# Patient Record
Sex: Male | Born: 1965 | Hispanic: Yes | Marital: Married | Smoking: Never smoker
Health system: Southern US, Community
[De-identification: ages and names within clinical notes are randomized; demographics above are authoritative.]

## PROBLEM LIST (undated history)

## (undated) DIAGNOSIS — K219 Gastro-esophageal reflux disease without esophagitis: Secondary | ICD-10-CM

## (undated) DIAGNOSIS — E785 Hyperlipidemia, unspecified: Secondary | ICD-10-CM

## (undated) DIAGNOSIS — J3089 Other allergic rhinitis: Secondary | ICD-10-CM

## (undated) DIAGNOSIS — K5900 Constipation, unspecified: Secondary | ICD-10-CM

## (undated) HISTORY — DX: Hyperlipidemia, unspecified: E78.5

## (undated) HISTORY — DX: Constipation, unspecified: K59.00

## (undated) HISTORY — DX: Other allergic rhinitis: J30.89

---

## 2011-05-19 ENCOUNTER — Ambulatory Visit: Payer: Self-pay | Admitting: Family Medicine

## 2011-05-19 VITALS — BP 143/78 | HR 101 | Temp 98.0°F | Resp 16 | Ht 68.5 in | Wt 177.0 lb

## 2011-05-19 DIAGNOSIS — R002 Palpitations: Secondary | ICD-10-CM

## 2011-05-20 ENCOUNTER — Encounter: Payer: Self-pay | Admitting: Family Medicine

## 2011-05-20 NOTE — Progress Notes (Signed)
  [facility]:  Office Visit  Chief Complaint: No chief complaint on file.   HPI: Eric Beasley is a 46 y.o. male who complains of  Chest palpitations since this morning around 10 am. He took a energy drink without diluting it and it had 24 servings in the bottle, each serving contained 50 mg of caffeine. Patient did not read the label before downing the bottle. He wanted to get some more energy for his morning run. No other associated sxs: denies CP, SOB, HA, vision changes, numbness, tingling, diaphoresis, weakness. Denies illicit drug use, denies h/o HTN, XOl, T2DM, tobacco use.  History reviewed. No pertinent past medical history. History reviewed. No pertinent past surgical history. History   Social History  . Marital Status: Unknown    Spouse Name: N/A    Number of Children: N/A  . Years of Education: N/A   Social History Main Topics  . Smoking status: Never Smoker   . Smokeless tobacco: None  . Alcohol Use: No  . Drug Use: No  . Sexually Active:    Other Topics Concern  . None   Social History Narrative  . None   History reviewed. No pertinent family history. Allergies not on file Prior to Admission medications   Not on File     ROS: The patient denies fevers, chills, night sweats, unintentional weight loss, chest pain,wheezing, dyspnea on exertion, nausea, vomiting, abdominal pain, dysuria, hematuria, melena, numbness, weakness, or tingling. + palpitations,   All other systems have been reviewed and were otherwise negative with the exception of those mentioned in the HPI and as above.    PHYSICAL EXAM: Filed Vitals:   05/19/11 1513  BP: 143/78  Pulse: 101  Temp: 98 F (36.7 C)  Resp: 16   Filed Vitals:   05/19/11 1513  Height: 5' 8.5" (1.74 m)  Weight: 177 lb (80.287 kg)   Body mass index is 26.52 kg/(m^2).  General: Alert, no acute distress HEENT:  Normocephalic, atraumatic, oropharynx patent.  Cardiovascular:   Regular rate and rhythm, no rubs murmurs or gallops.  No Carotid bruits, radial pulse intact. No pedal edema.  Respiratory: Clear to auscultation bilaterally.  No wheezes, rales, or rhonchi.  No cyanosis, no use of accessory musculature GI: No organomegaly, abdomen is soft and non-tender, positive bowel sounds.  No masses. Skin: No rashes. Neurologic: Facial musculature symmetric. Psychiatric: Patient is appropriate throughout our interaction. Lymphatic: No cervical lymphadenopathy Musculoskeletal: Gait intact.   LABS: No results found for this or any previous visit.   EKG/XRAY:   Primary read interpreted by Dr. Conley Rolls at [facility]. EKG was NSR at 94 bpm, no ST/T wave changes   ASSESSMENT/PLAN: Encounter Diagnosis  Name Primary?  . Heart palpitations Yes   Advise patient to push fluids, will be several hours if not full day for caffeine to get out of his system. If he has worsening sxs or new sxs then need to go to ER for evaluation.     Hamilton Capri PHUONG, DO 05/21/2011 5:48 PM

## 2011-06-06 ENCOUNTER — Encounter: Payer: Self-pay | Admitting: Family Medicine

## 2011-10-21 ENCOUNTER — Ambulatory Visit
Admission: RE | Admit: 2011-10-21 | Discharge: 2011-10-21 | Disposition: A | Discharge status: Home / Self Care | Payer: No Typology Code available for payment source | Source: Ambulatory Visit | Attending: Geriatric Medicine | Admitting: Geriatric Medicine

## 2011-10-21 ENCOUNTER — Other Ambulatory Visit: Payer: Self-pay | Admitting: Geriatric Medicine

## 2011-10-21 DIAGNOSIS — R1032 Left lower quadrant pain: Secondary | ICD-10-CM

## 2013-10-26 ENCOUNTER — Ambulatory Visit: Payer: Self-pay | Attending: Internal Medicine

## 2014-04-13 ENCOUNTER — Ambulatory Visit: Payer: Self-pay | Attending: Internal Medicine

## 2015-03-12 DIAGNOSIS — K219 Gastro-esophageal reflux disease without esophagitis: Secondary | ICD-10-CM

## 2015-03-12 HISTORY — DX: Gastro-esophageal reflux disease without esophagitis: K21.9

## 2016-02-24 ENCOUNTER — Emergency Department (HOSPITAL_BASED_OUTPATIENT_CLINIC_OR_DEPARTMENT_OTHER)
Admission: EM | Admit: 2016-02-24 | Discharge: 2016-02-25 | Disposition: A | Discharge status: Home / Self Care | Payer: Self-pay | Attending: Emergency Medicine | Admitting: Emergency Medicine

## 2016-02-24 ENCOUNTER — Encounter (HOSPITAL_BASED_OUTPATIENT_CLINIC_OR_DEPARTMENT_OTHER): Payer: Self-pay | Admitting: Emergency Medicine

## 2016-02-24 ENCOUNTER — Emergency Department (HOSPITAL_BASED_OUTPATIENT_CLINIC_OR_DEPARTMENT_OTHER): Payer: Self-pay

## 2016-02-24 DIAGNOSIS — R1013 Epigastric pain: Secondary | ICD-10-CM | POA: Insufficient documentation

## 2016-02-24 DIAGNOSIS — K5900 Constipation, unspecified: Secondary | ICD-10-CM | POA: Insufficient documentation

## 2016-02-24 DIAGNOSIS — R1012 Left upper quadrant pain: Secondary | ICD-10-CM | POA: Insufficient documentation

## 2016-02-24 HISTORY — DX: Gastro-esophageal reflux disease without esophagitis: K21.9

## 2016-02-24 LAB — CBC WITH DIFFERENTIAL/PLATELET
BASOS ABS: 0 10*3/uL (ref 0.0–0.1)
BASOS PCT: 1 %
EOS ABS: 0.2 10*3/uL (ref 0.0–0.7)
EOS PCT: 4 %
HCT: 41.6 % (ref 39.0–52.0)
Hemoglobin: 14.7 g/dL (ref 13.0–17.0)
LYMPHS PCT: 42 %
Lymphs Abs: 2.4 10*3/uL (ref 0.7–4.0)
MCH: 30.4 pg (ref 26.0–34.0)
MCHC: 35.3 g/dL (ref 30.0–36.0)
MCV: 86 fL (ref 78.0–100.0)
Monocytes Absolute: 0.3 10*3/uL (ref 0.1–1.0)
Monocytes Relative: 6 %
Neutro Abs: 2.8 10*3/uL (ref 1.7–7.7)
Neutrophils Relative %: 47 %
PLATELETS: 253 10*3/uL (ref 150–400)
RBC: 4.84 MIL/uL (ref 4.22–5.81)
RDW: 12.4 % (ref 11.5–15.5)
WBC: 5.8 10*3/uL (ref 4.0–10.5)

## 2016-02-24 LAB — COMPREHENSIVE METABOLIC PANEL
ALT: 38 U/L (ref 17–63)
AST: 27 U/L (ref 15–41)
Albumin: 4.4 g/dL (ref 3.5–5.0)
Alkaline Phosphatase: 53 U/L (ref 38–126)
Anion gap: 7 (ref 5–15)
BILIRUBIN TOTAL: 0.4 mg/dL (ref 0.3–1.2)
BUN: 18 mg/dL (ref 6–20)
CHLORIDE: 104 mmol/L (ref 101–111)
CO2: 26 mmol/L (ref 22–32)
CREATININE: 0.83 mg/dL (ref 0.61–1.24)
Calcium: 9.1 mg/dL (ref 8.9–10.3)
Glucose, Bld: 99 mg/dL (ref 65–99)
POTASSIUM: 3.5 mmol/L (ref 3.5–5.1)
Sodium: 137 mmol/L (ref 135–145)
TOTAL PROTEIN: 7.4 g/dL (ref 6.5–8.1)

## 2016-02-24 LAB — LIPASE, BLOOD: LIPASE: 20 U/L (ref 11–51)

## 2016-02-24 MED ORDER — GI COCKTAIL ~~LOC~~
30.0000 mL | Freq: Once | ORAL | Status: AC
  Administered 2016-02-24: 30 mL via ORAL
  Filled 2016-02-24: qty 30

## 2016-02-24 NOTE — ED Notes (Signed)
Per friend, has had stomach problems for a year? Worse after eating, denies ETOH except occasional beer. Has had constipation in the past, last BM this am, denies n/v

## 2016-02-24 NOTE — ED Provider Notes (Signed)
[facility] Provider Note   CSN: [id number] Arrival date & time: 02/24/16  2104  By signing my name below, I, Eric Beasley, attest that this documentation has been prepared under the direction and in the presence of Geoffery Lyonsouglas Makiah Foye, MD . Electronically Signed: Nelwyn SalisburyJoshua Beasley, Scribe. 02/24/2016. 11:16 PM.  History   Chief Complaint Chief Complaint  Patient presents with  . Abdominal Pain   The history is provided by a relative. The history is limited by a language barrier. No language interpreter was used.  Abdominal Pain   This is a chronic problem. The current episode started more than 1 week ago. The problem occurs constantly. The problem has not changed since onset.The pain is associated with eating. The pain is located in the epigastric region. The pain is at a severity of 8/10. The pain is moderate. Associated symptoms include constipation. Pertinent negatives include fever, diarrhea and vomiting. The symptoms are aggravated by eating. Nothing relieves the symptoms.    HPI Comments:  Eric Beasley is a 50 y.o. male with pmhx of GERD who presents to the Emergency Department complaining of sudden-onset constant worsened epigastric abdominal pain which began a year ago but has exacerbated in the past two weeks. Pt describes his symptoms as an 8/10 burning pain that began on the left side of his abdomen, but has moved to the right side. He describes a sensation of "stuck food" and gassiness, exacerbated by eating. No alleviating factors indicated. He reports associated constipation. Pt denies any vomiting, diarrhea, or fever.   Past Medical History:  Diagnosis Date  . GERD (gastroesophageal reflux disease)     There are no active problems to display for this patient.   History reviewed. No pertinent surgical history.     Home Medications    Prior to Admission medications   Not on File    Family History History reviewed. No pertinent family  history.  Social History Social History  Substance Use Topics  . Smoking status: Never Smoker  . Smokeless tobacco: Never Used  . Alcohol use No     Allergies   Naproxen   Review of Systems Review of Systems  Constitutional: Negative for fever.  Gastrointestinal: Positive for abdominal pain and constipation. Negative for diarrhea and vomiting.  All other systems reviewed and are negative.    Physical Exam Updated Vital Signs BP 141/94 (BP Location: Right Arm)   Pulse 66   Temp 98.4 F (36.9 C) (Oral)   Resp 20   SpO2 100%   Physical Exam  Constitutional: He is oriented to person, place, and time. He appears well-developed and well-nourished.  HENT:  Head: Normocephalic and atraumatic.  Eyes: EOM are normal.  Neck: Normal range of motion.  Cardiovascular: Normal rate, regular rhythm, normal heart sounds and intact distal pulses.   Pulmonary/Chest: Effort normal and breath sounds normal. No respiratory distress.  Abdominal: Soft. Bowel sounds are normal. He exhibits no distension. There is tenderness.  TTP in epigastrum and left upper quadrant  Musculoskeletal: Normal range of motion.  Neurological: He is alert and oriented to person, place, and time.  Skin: Skin is warm and dry.  Psychiatric: He has a normal mood and affect. Judgment normal.  Nursing note and vitals reviewed.    ED Treatments / Results  DIAGNOSTIC STUDIES:  Oxygen Saturation is 100% on RA, normal by my interpretation.    COORDINATION OF CARE:  11:21 PM Discussed treatment plan with pt at bedside which includes blood work and imaging and  pt agreed to plan.  Labs (all labs ordered are listed, but only abnormal results are displayed) Labs Reviewed - No data to display  EKG  EKG Interpretation None       Radiology No results found.  Procedures Procedures (including critical care time)  Medications Ordered in ED Medications - No data to display   Initial Impression /  Assessment and Plan / ED Course  I have reviewed the triage vital signs and the nursing notes.  Pertinent labs & imaging results that were available during my care of the patient were reviewed by me and considered in my medical decision making (see chart for details).  Clinical Course     Patient presents with epigastric burning. His symptoms improved with a GI cocktail and workup is essentially unremarkable. Obstruction series reveals no abnormal bowel gas pattern or stool burden. I suspect GERD. He will be treated with omeprazole and when necessary follow-up with his primary doctor.  Final Clinical Impressions(s) / ED Diagnoses   Final diagnoses:  None    New Prescriptions New Prescriptions   No medications on file  I personally performed the services described in this documentation, which was scribed in my presence. The recorded information has been reviewed and is accurate.        Geoffery Lyonsouglas Eric Weckerly, MD 02/25/16 [phone]

## 2016-02-24 NOTE — ED Triage Notes (Signed)
Patient states that he is having burning in his stomach after he eats x 3 years Patient repors that he beeing treated for GERD - and he got better.

## 2016-02-25 MED ORDER — OMEPRAZOLE 20 MG PO CPDR: 20.0000 mg | DELAYED_RELEASE_CAPSULE | Freq: Two times a day (BID) | ORAL | 0 refills | Status: DC

## 2016-02-25 NOTE — ED Notes (Signed)
Pt and wife given d/c instructions as per chart. Rx x 1. Verbalizes understanding. No questions. 

## 2016-02-25 NOTE — Discharge Instructions (Signed)
Prilosec as prescribed.  Follow-up with your primary Dr. if not improving in the next week, and return to the ER if your symptoms significantly worsen or change.

## 2017-05-19 IMAGING — CR DG ABDOMEN ACUTE W/ 1V CHEST
3 series · 3 of 3 positions shown · non-contrast
Comparison: Abdominal radiograph October 21, 2011

CLINICAL DATA: Burning abdominal pain after eating for 3 years.
History of reflux disease.

EXAM:
DG ABDOMEN ACUTE W/ 1V CHEST

[w chest pa]
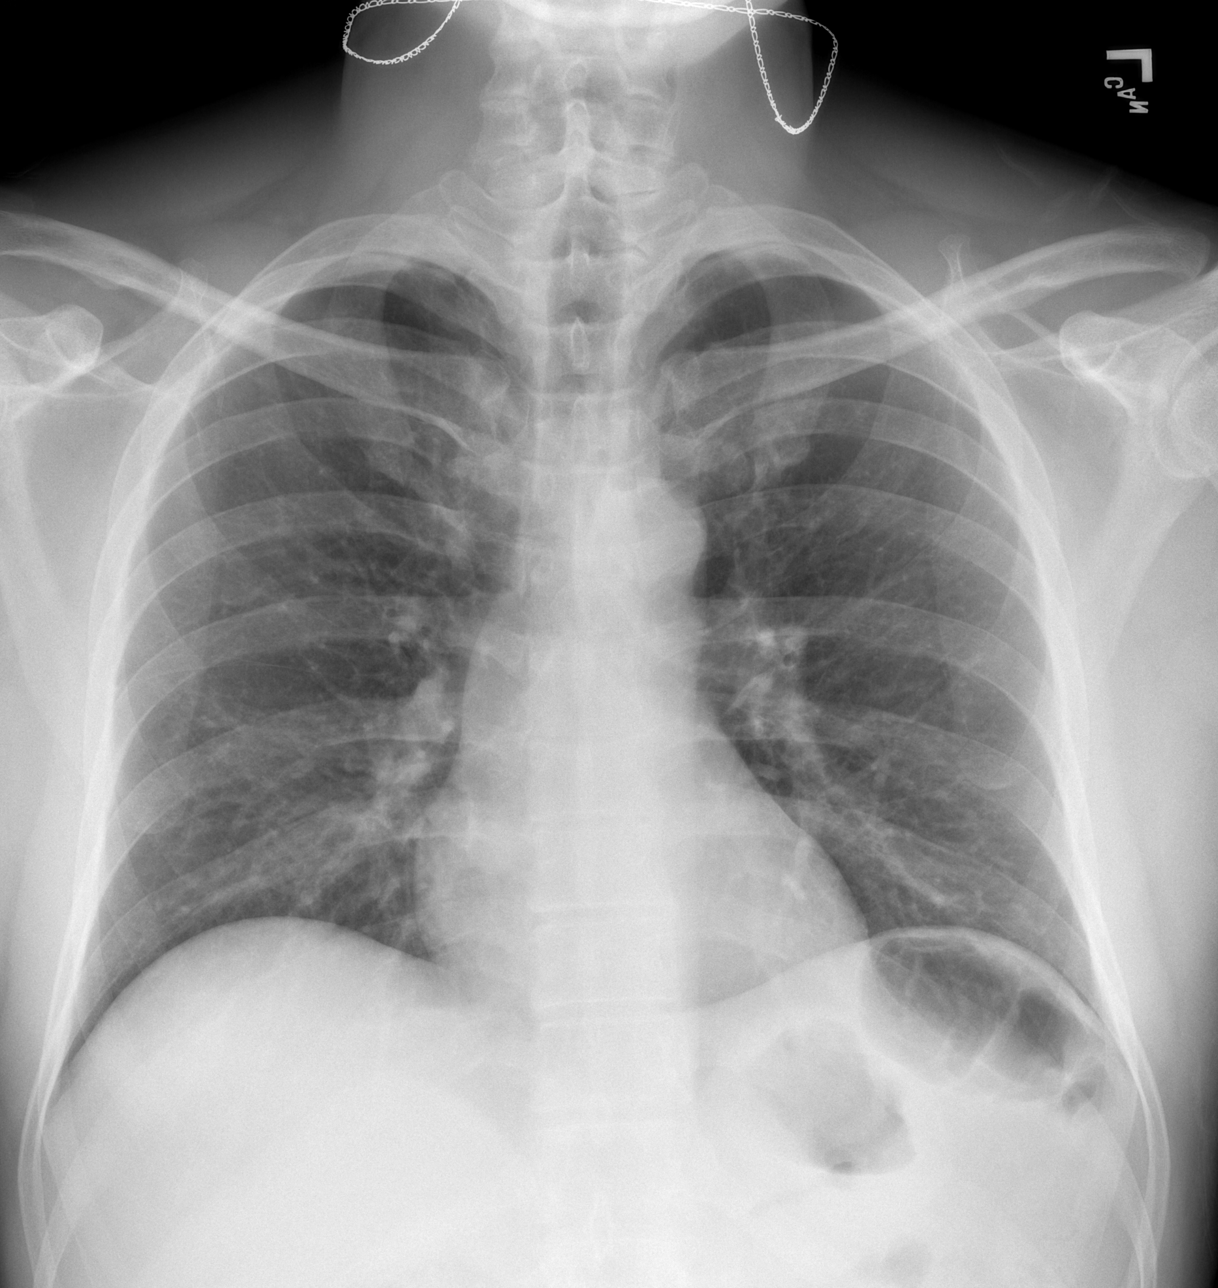

[w abdomen upright]
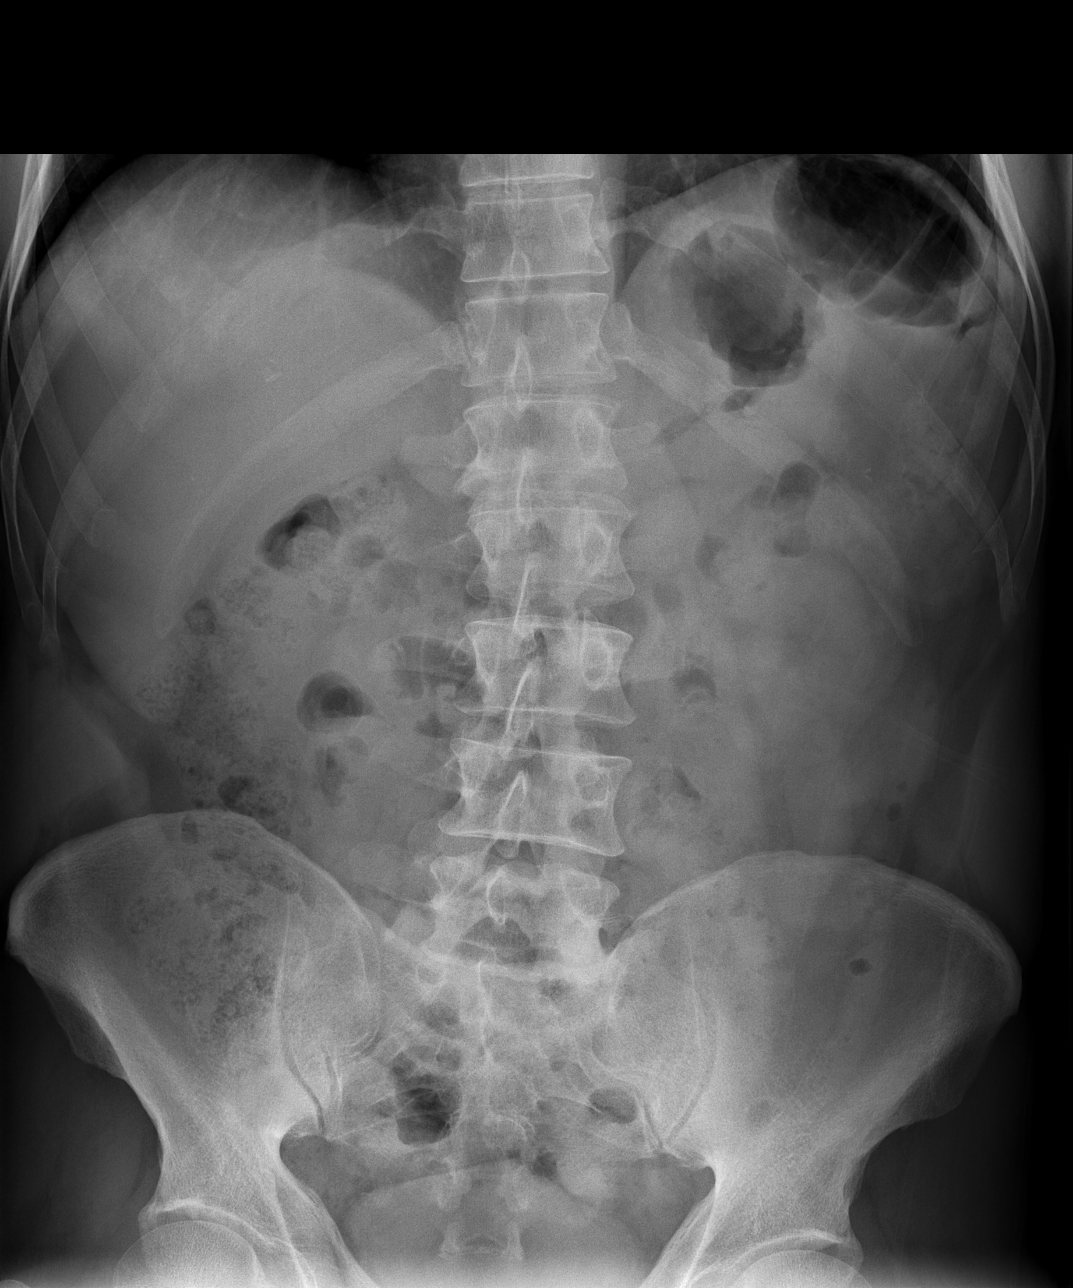

[t abdomen supine]
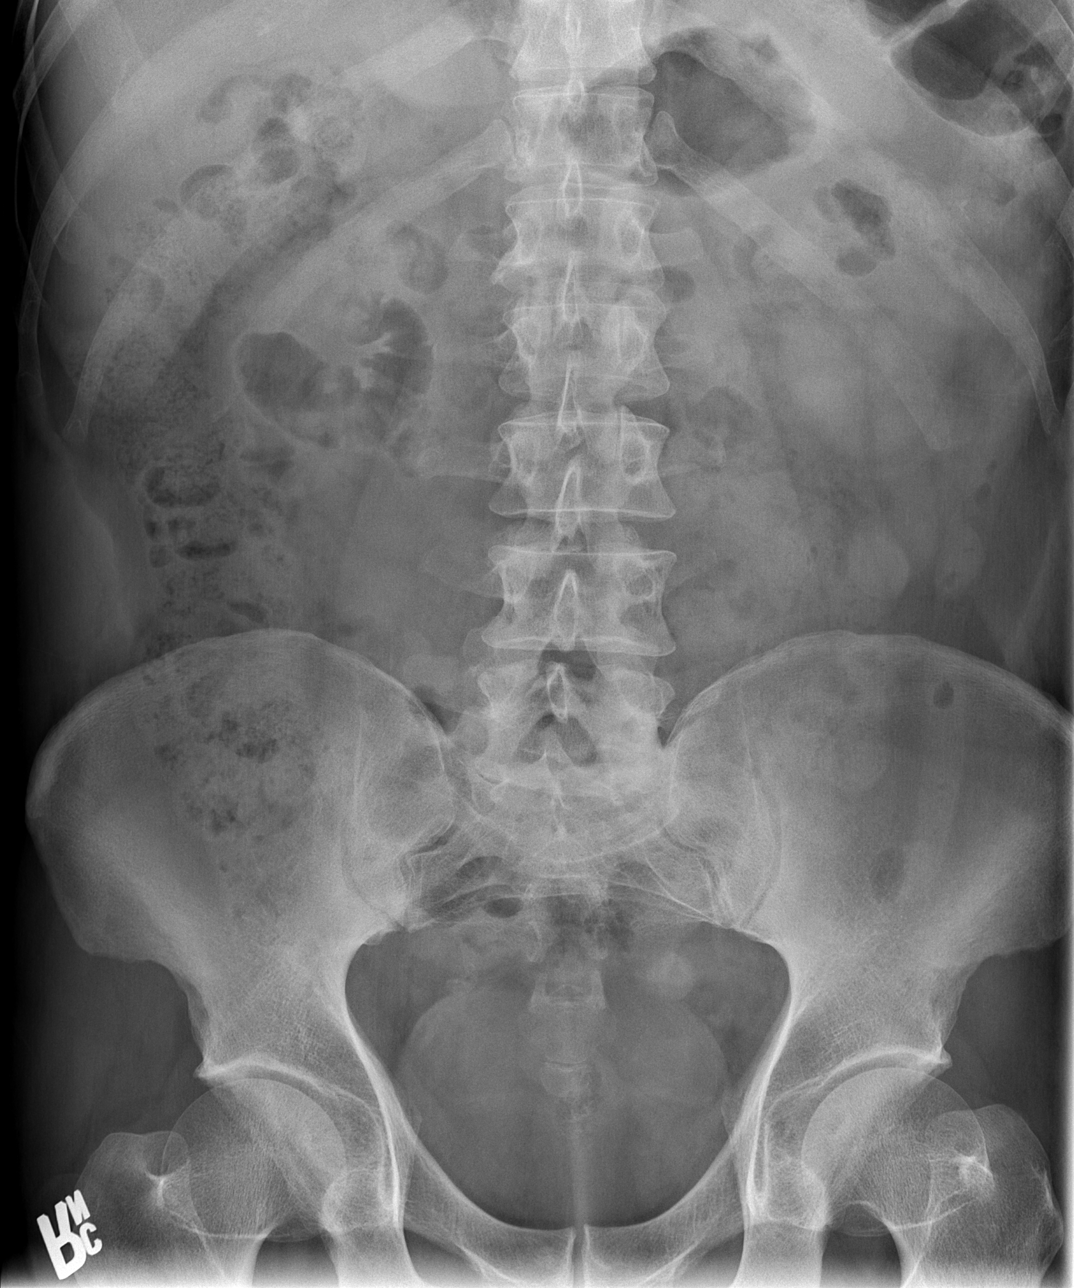

[3 of 3 positions shown; findings below may reference images not displayed]

FINDINGS: Cardiomediastinal silhouette is normal. Lungs are clear, no pleural
effusions. No pneumothorax. Soft tissue planes and included osseous
structures are unremarkable.

Bowel gas pattern is nondilated and nonobstructive. No
intra-abdominal mass effect, pathologic calcifications or free air.
Soft tissue planes and included osseous structures are
non-suspicious.
IMPRESSION: Normal chest radiograph.

Normal bowel gas pattern.

## 2020-02-01 ENCOUNTER — Ambulatory Visit: Payer: Self-pay | Admitting: Internal Medicine

## 2020-02-01 ENCOUNTER — Other Ambulatory Visit: Payer: Self-pay

## 2020-02-01 ENCOUNTER — Encounter: Payer: Self-pay | Admitting: Internal Medicine

## 2020-02-01 VITALS — BP 140/90 | HR 72 | Resp 14 | Ht 66.0 in | Wt 171.0 lb

## 2020-02-01 DIAGNOSIS — R140 Abdominal distension (gaseous): Secondary | ICD-10-CM

## 2020-02-01 DIAGNOSIS — Z9189 Other specified personal risk factors, not elsewhere classified: Secondary | ICD-10-CM

## 2020-02-01 DIAGNOSIS — S39011S Strain of muscle, fascia and tendon of abdomen, sequela: Secondary | ICD-10-CM

## 2020-02-01 DIAGNOSIS — R030 Elevated blood-pressure reading, without diagnosis of hypertension: Secondary | ICD-10-CM

## 2020-02-01 DIAGNOSIS — J3089 Other allergic rhinitis: Secondary | ICD-10-CM

## 2020-02-01 MED ORDER — LORATADINE 10 MG PO TABS: 10.0000 mg | ORAL_TABLET | Freq: Every day | ORAL | 11 refills | Status: DC

## 2020-02-01 NOTE — Patient Instructions (Signed)

## 2020-02-01 NOTE — Progress Notes (Signed)
Subjective:    Patient ID: Eric Beasley, male   DOB: Jul 24, 1965, 54 y.o.   MRN: [id number]   HPI   Here to establish. Estefania Geanie Berlin interpreting.  1.  Abdominal pain:  Left lower abdominal pain.  Feels like the pain is inside his abdominal cavity, not the wall.  Hurts more with eating too much.  Does not hurt any other time.  If passes gas or has a BM, pain improves.  When he eats spicy food or dairy products, his BMs are odiferous.  Has a lot of abdominal gas noise also with eating above items.   No problem if does not overeat or stays away from lactose. Also, had intermittent constipation which responded well to Metamucil in the past.  Not a big vegetable eater.   2  Secondary pain in abdomen--same area LLQ:  Was performing sort of reverse push ups 4-5 months ago and felt something pull in that area.  States he was working out at the time and is afraid it will recur if works out.  Apparently, has continued to work out without problem, just has not performed the same sort of reverse push ups.  Has not noted anything that sounds like a hernia in the area with working out.  No discomfort there since.    3.  Elevated BP:  Has been told this in the past.  Apparently, was treated with medication.  States his libido decreased when used the medication, so stopped on his own.  Sounds like about 1 year ago.  Took the medication once daily.    4.  Allergies:  Itching eyes and skin.  Eyes do not turn red or water.  Taking Claritin.    No outpatient medications have been marked as taking for the 02/01/20 encounter (Office Visit) with Julieanne Manson, MD.   Allergies  Allergen Reactions  . Naproxen Rash   Past Medical History:  Diagnosis Date  . GERD (gastroesophageal reflux disease) 2017    History reviewed. No pertinent surgical history.  Family History  Problem Relation Age of Onset  . Cirrhosis Father   . Alcohol abuse Father     Social History    Socioeconomic History  . Marital status: Married    Spouse name: Conception Rosas  . Number of children: 3  . Years of education: 6  . Highest education level: 6th grade  Occupational History  . Occupation: IT consultant  Tobacco Use  . Smoking status: Never Smoker  . Smokeless tobacco: Never Used  Vaping Use  . Vaping Use: Never used  Substance and Sexual Activity  . Alcohol use: Yes    Comment: Rare  . Drug use: No  . Sexual activity: Yes    Birth control/protection: None  Other Topics Concern  . Not on file  Social History Narrative   Lives with his wife and a friend   His 2 children with current wife live in [place] as does his oldest daughter.   Social Determinants of Health   Financial Resource Strain: Low Risk   . Difficulty of Paying Living Expenses: Not very hard  Food Insecurity: No Food Insecurity  . Worried About Programme researcher, broadcasting/film/video in the Last Year: Never true  . Ran Out of Food in the Last Year: Never true  Transportation Needs: No Transportation Needs  . Lack of Transportation (Medical): No  . Lack of Transportation (Non-Medical): No  Physical Activity:   . Days of Exercise per Week: Not on  file  . Minutes of Exercise per Session: Not on file  Stress:   . Feeling of Stress : Not on file  Social Connections:   . Frequency of Communication with Friends and Family: Not on file  . Frequency of Social Gatherings with Friends and Family: Not on file  . Attends Religious Services: Not on file  . Active Member of Clubs or Organizations: Not on file  . Attends Banker Meetings: Not on file  . Marital Status: Not on file  Intimate Partner Violence: Not At Risk  . Fear of Current or Ex-Partner: No  . Emotionally Abused: No  . Physically Abused: No  . Sexually Abused: No      Review of Systems    Objective:   BP 140/90 (BP Location: Left Arm, Patient Position: Sitting, Cuff Size: Normal)   Pulse 72   Resp 14   Ht 5\' 6"  (1.676 m)    Wt 171 lb (77.6 kg)   BMI 27.60 kg/m   Physical Exam  NAD Appears in good shape HEENT:   PERRL, EOMI, no injection or watering, nasal mucosa mildly injected, but dry.  TMs pearly gray Throat without injection Neck:  Supple, No adenopathy, no thyromegaly Chest:  CTA CV:  RRR with normal S1 and S2, No S3, S4 or murmur.  No carotid bruits.  Carotid, radial and DP pulses normal and equal Abd:  S, Mild tenderness at edge of abdominis rectus muscle in LLQ, No rebound or peritoneal signs.  No HSM or mass, No focal muscle opening or hernia palpated.  + BS LE:  No edema.   Assessment & Plan  1.  Abdominal Pain:  Postprandial bloating by his description.  Encouraged a healthy diet with increased vegetables and fruits/fiber.  Restart Metamucil. Increase water intake.   Avoid lactose:  Take Lactaid with milk containing products and switch to almond, soy milk or Lactaid milk Avoid overeating. If continues with symptoms following dietary changes, consider antispasmodic Also with left rectus tenderness on exam today--stretches recommended. Follow up in 2 weeks for fasting labs as well:  CBC, CMP, FLP  2.  Elevated BP:  Nurse check in 2 weeks with labs.  Consider medication if still elevated.  3.  Allergies:  Loratadine 10 mg daily as needed.  4.  Need for Dental Care:  Dental referral.

## 2020-03-19 DIAGNOSIS — R140 Abdominal distension (gaseous): Secondary | ICD-10-CM | POA: Insufficient documentation

## 2020-06-12 ENCOUNTER — Encounter: Payer: Self-pay | Admitting: Internal Medicine

## 2020-06-12 ENCOUNTER — Other Ambulatory Visit: Payer: Self-pay

## 2020-06-12 ENCOUNTER — Ambulatory Visit: Payer: Self-pay | Admitting: Internal Medicine

## 2020-06-12 VITALS — BP 116/84

## 2020-06-12 DIAGNOSIS — R140 Abdominal distension (gaseous): Secondary | ICD-10-CM

## 2020-06-12 DIAGNOSIS — Z13220 Encounter for screening for lipoid disorders: Secondary | ICD-10-CM

## 2020-06-12 DIAGNOSIS — R030 Elevated blood-pressure reading, without diagnosis of hypertension: Secondary | ICD-10-CM

## 2020-06-12 NOTE — Progress Notes (Signed)
BP fine today on recheck.

## 2020-06-13 LAB — CBC WITH DIFFERENTIAL/PLATELET
Basophils Absolute: 0.1 10*3/uL (ref 0.0–0.2)
Basos: 2 %
EOS (ABSOLUTE): 0.1 10*3/uL (ref 0.0–0.4)
Eos: 3 %
Hematocrit: 41.4 % (ref 37.5–51.0)
Hemoglobin: 14.2 g/dL (ref 13.0–17.7)
Immature Grans (Abs): 0 10*3/uL (ref 0.0–0.1)
Immature Granulocytes: 1 %
Lymphocytes Absolute: 1.4 10*3/uL (ref 0.7–3.1)
Lymphs: 41 %
MCH: 30.3 pg (ref 26.6–33.0)
MCHC: 34.3 g/dL (ref 31.5–35.7)
MCV: 88 fL (ref 79–97)
Monocytes Absolute: 0.3 10*3/uL (ref 0.1–0.9)
Monocytes: 8 %
Neutrophils Absolute: 1.6 10*3/uL (ref 1.4–7.0)
Neutrophils: 45 %
Platelets: 257 10*3/uL (ref 150–450)
RBC: 4.69 x10E6/uL (ref 4.14–5.80)
RDW: 13.1 % (ref 11.6–15.4)
WBC: 3.4 10*3/uL (ref 3.4–10.8)

## 2020-06-13 LAB — LIPID PANEL W/O CHOL/HDL RATIO
Cholesterol, Total: 234 mg/dL — ABNORMAL HIGH (ref 100–199)
HDL: 38 mg/dL — ABNORMAL LOW (ref >39)
LDL Chol Calc (NIH): 153 mg/dL — ABNORMAL HIGH (ref 0–99)
Triglycerides: 235 mg/dL — ABNORMAL HIGH (ref 0–149)
VLDL Cholesterol Cal: 43 mg/dL — ABNORMAL HIGH (ref 5–40)

## 2020-06-13 LAB — COMPREHENSIVE METABOLIC PANEL
ALT: 28 IU/L (ref 0–44)
AST: 22 IU/L (ref 0–40)
Albumin/Globulin Ratio: 1.8 (ref 1.2–2.2)
Albumin: 4.7 g/dL (ref 3.8–4.9)
Alkaline Phosphatase: 61 IU/L (ref 44–121)
BUN/Creatinine Ratio: 20 (ref 9–20)
BUN: 17 mg/dL (ref 6–24)
Bilirubin Total: 0.5 mg/dL (ref 0.0–1.2)
CO2: 22 mmol/L (ref 20–29)
Calcium: 9.1 mg/dL (ref 8.7–10.2)
Chloride: 104 mmol/L (ref 96–106)
Creatinine, Ser: 0.83 mg/dL (ref 0.76–1.27)
Globulin, Total: 2.6 g/dL (ref 1.5–4.5)
Glucose: 95 mg/dL (ref 65–99)
Potassium: 4.2 mmol/L (ref 3.5–5.2)
Sodium: 141 mmol/L (ref 134–144)
Total Protein: 7.3 g/dL (ref 6.0–8.5)
eGFR: 105 mL/min/{1.73_m2} (ref >59)

## 2021-01-22 ENCOUNTER — Other Ambulatory Visit: Payer: Self-pay

## 2021-01-22 DIAGNOSIS — Z13220 Encounter for screening for lipoid disorders: Secondary | ICD-10-CM

## 2021-01-23 LAB — LIPID PANEL W/O CHOL/HDL RATIO
Cholesterol, Total: 201 mg/dL — ABNORMAL HIGH (ref 100–199)
HDL: 38 mg/dL — ABNORMAL LOW (ref >39)
LDL Chol Calc (NIH): 129 mg/dL — ABNORMAL HIGH (ref 0–99)
Triglycerides: 192 mg/dL — ABNORMAL HIGH (ref 0–149)
VLDL Cholesterol Cal: 34 mg/dL (ref 5–40)

## 2021-01-26 ENCOUNTER — Other Ambulatory Visit: Payer: Self-pay

## 2021-01-26 ENCOUNTER — Ambulatory Visit: Payer: Self-pay | Admitting: Internal Medicine

## 2021-01-26 ENCOUNTER — Encounter: Payer: Self-pay | Admitting: Internal Medicine

## 2021-01-26 VITALS — BP 130/86 | HR 68 | Resp 12 | Ht 67.0 in | Wt 170.0 lb

## 2021-01-26 DIAGNOSIS — B351 Tinea unguium: Secondary | ICD-10-CM

## 2021-01-26 DIAGNOSIS — Z125 Encounter for screening for malignant neoplasm of prostate: Secondary | ICD-10-CM

## 2021-01-26 DIAGNOSIS — R140 Abdominal distension (gaseous): Secondary | ICD-10-CM

## 2021-01-26 DIAGNOSIS — Z79899 Other long term (current) drug therapy: Secondary | ICD-10-CM

## 2021-01-26 DIAGNOSIS — Z0000 Encounter for general adult medical examination without abnormal findings: Secondary | ICD-10-CM

## 2021-01-26 DIAGNOSIS — E782 Mixed hyperlipidemia: Secondary | ICD-10-CM

## 2021-01-26 MED ORDER — TERBINAFINE HCL 250 MG PO TABS: 250.0000 mg | ORAL_TABLET | Freq: Every day | ORAL | 0 refills | Status: DC

## 2021-01-26 NOTE — Progress Notes (Signed)
Subjective:    Patient ID: Eric Beasley, male   DOB: July 07, 1965, 55 y.o.   MRN: [id number]   HPI  Here for Male CPE:  1.  STE:  Does not perform.  No family history of testicular cancer.    2.  PSA:  Not drawn with fasting lipids 4 days ago.  May have been checked about 5 years ago and believes it was normal.  No family history of prostate cancer.    3.  Guaiac Cards/FIT:  May have performed many years ago and negative for blood.     4.  Colonoscopy:   Never.  No family history of colon cancer.   5.  Cholesterol/Glucose:  Hyperlipidemia, improved from April.  Total, HDL, Trigs remain out of goal range.  Changed his diet with better fats, and regular physical activity.  Glucose levels have been normal.    6.  Immunizations:  Thinks he received a tetanus vaccine about 5 years ago.  Suffered a laceration to his finger and his job sent him to a particular clinic.  Does not have documentation.   Has not ever received influenza vaccine.   Has received 3 COVID vaccines, but not the bivalent booster. Has not received the Shingrix vaccine.    Current Meds  Medication Sig   loratadine (CLARITIN) 10 MG tablet Take 1 tablet (10 mg total) by mouth daily.   psyllium (METAMUCIL) 58.6 % packet Take 1 packet by mouth daily.   Allergies  Allergen Reactions   Naproxen Rash   Past Medical History:  Diagnosis Date   GERD (gastroesophageal reflux disease) 2017   Hyperlipidemia    No past surgical history on file.  Family History  Problem Relation Age of Onset   Cirrhosis Father    Alcohol abuse Father    Family Status  Relation Name Status   Mother  Alive, age 73y   Father  Deceased at age 60   Sister  Alive   Brother  Alive   Daughter [place] Alive, age 15y   Sister  Alive   Sister  Alive   Sister  Alive   Brother  Alive   Brother  Alive   Daughter [place] Alive, age 13y   Daughter [place] Alive, age 22y       different mother than his younger daughters      Social History   Socioeconomic History   Marital status: Married    Spouse name: Conception Rosas   Number of children: 3   Years of education: 6   Highest education level: 6th grade  Occupational History   Occupation: IT consultant  Tobacco Use   Smoking status: Never   Smokeless tobacco: Never  Building services engineer Use: Never used  Substance and Sexual Activity   Alcohol use: Yes    Comment: Rare   Drug use: No   Sexual activity: Yes    Birth control/protection: None  Other Topics Concern   Not on file  Social History Narrative   Lives with his wife and a friend   His 2 children with current wife live in [place] as does his oldest daughter.   Social Determinants of Health   Financial Resource Strain: Low Risk    Difficulty of Paying Living Expenses: Not hard at all  Food Insecurity: No Food Insecurity   Worried About Programme researcher, broadcasting/film/video in the Last Year: Never true   Ran Out of Food in the Last Year: Never true  Transportation  Needs: No Transportation Needs   Lack of Transportation (Medical): No   Lack of Transportation (Non-Medical): No  Physical Activity: Not on file  Stress: Not on file  Social Connections: Not on file  Intimate Partner Violence: Not At Risk   Fear of Current or Ex-Partner: No   Emotionally Abused: No   Physically Abused: No   Sexually Abused: No     Review of Systems  HENT:  Negative for dental problem.   Respiratory:  Negative for shortness of breath.   Cardiovascular:  Negative for chest pain, palpitations and leg swelling.  Gastrointestinal:  Negative for blood in stool (No melena).       Epigastric pain after he eats too much.  May be also when he eats too fast.  If he is leaning forward, the discomfort is more prominent.  If reclines slightly and stretches out abdomen, no discomfort.   He takes Metamucil ever morning.  Feels it helps.    Neurological:  Negative for weakness and numbness.  Psychiatric/Behavioral:  Negative  for dysphoric mood. The patient is not nervous/anxious.      Objective:   BP 130/86 (BP Location: Left Arm, Patient Position: Sitting, Cuff Size: Normal)   Pulse 68   Resp 12   Ht 5\' 7"  (1.702 m)   Wt 170 lb (77.1 kg)   BMI 26.63 kg/m   Physical Exam HENT:     Head: Normocephalic and atraumatic.     Right Ear: Tympanic membrane, ear canal and external ear normal.     Left Ear: Tympanic membrane, ear canal and external ear normal.     Mouth/Throat:     Mouth: Mucous membranes are moist.     Pharynx: Oropharynx is clear.  Eyes:     Extraocular Movements: Extraocular movements intact.     Conjunctiva/sclera: Conjunctivae normal.     Pupils: Pupils are equal, round, and reactive to light.     Comments: Discs sharp  Neck:     Thyroid: No thyroid mass or thyromegaly.  Cardiovascular:     Rate and Rhythm: Normal rate and regular rhythm.     Heart sounds: S1 normal and S2 normal. No murmur heard.    No friction rub. No S3 or S4 sounds.     Comments: No carotid bruits.  Carotid, radial, femoral, DP and PT pulses normal and equal.   Pulmonary:     Effort: Pulmonary effort is normal.     Breath sounds: Normal breath sounds and air entry.  Abdominal:     General: Bowel sounds are normal.     Palpations: Abdomen is soft. There is no hepatomegaly, splenomegaly or mass.     Tenderness: There is no abdominal tenderness.     Hernia: No hernia is present.  Genitourinary:    Penis: Normal.      Testes:        Right: Tenderness or swelling not present. Right testis is descended.        Left: Tenderness or swelling not present. Left testis is descended.  Musculoskeletal:        General: Normal range of motion.     Cervical back: Normal range of motion and neck supple.     Right lower leg: No edema.     Left lower leg: No edema.  Feet:     Right foot:     Toenail Condition: Right toenails are abnormally thick. Fungal disease present.    Left foot:     Toenail Condition: Left  toenails are abnormally thick. Fungal disease present. Lymphadenopathy:     Head:     Right side of head: No submental or submandibular adenopathy.     Left side of head: No submental or submandibular adenopathy.     Cervical: No cervical adenopathy.     Upper Body:     Right upper body: No supraclavicular adenopathy.     Left upper body: No supraclavicular adenopathy.     Lower Body: No right inguinal adenopathy. No left inguinal adenopathy.  Skin:    General: Skin is warm.     Capillary Refill: Capillary refill takes less than 2 seconds.     Findings: No rash.  Neurological:     General: No focal deficit present.     Mental Status: He is alert and oriented to person, place, and time.     Cranial Nerves: Cranial nerves 2-12 are intact.     Sensory: Sensation is intact.     Motor: Motor function is intact.     Coordination: Coordination is intact.     Gait: Gait is intact.     Deep Tendon Reflexes: Reflexes are normal and symmetric.  Psychiatric:        Speech: Speech normal.        Behavior: Behavior normal. Behavior is cooperative.      Assessment & Plan    CPE Check to see if can add PSA to labs drawn 4 days ago.   Return in 1 week for influenza, COVID bivalent vaccine, and Shingrix. Will look in [facility] to see if Td documented.    2. Hyperlipidemia:  improved with lifestyle changes.   3.  Toenail onychomycosis:  Start Terbinafine.  Shoe and shower care discussed.  Follow up in 6 and 12 weeks.

## 2021-01-26 NOTE — Patient Instructions (Signed)

## 2021-02-05 ENCOUNTER — Other Ambulatory Visit: Payer: Self-pay

## 2021-02-05 ENCOUNTER — Ambulatory Visit (INDEPENDENT_AMBULATORY_CARE_PROVIDER_SITE_OTHER): Payer: Self-pay | Admitting: Internal Medicine

## 2021-02-05 DIAGNOSIS — Z23 Encounter for immunization: Secondary | ICD-10-CM

## 2021-02-19 LAB — COMPREHENSIVE METABOLIC PANEL
ALT: 24 IU/L (ref 0–44)
AST: 20 IU/L (ref 0–40)
Albumin/Globulin Ratio: 1.5 (ref 1.2–2.2)
Albumin: 4.4 g/dL (ref 3.8–4.9)
Alkaline Phosphatase: 65 IU/L (ref 44–121)
BUN/Creatinine Ratio: 23 — ABNORMAL HIGH (ref 9–20)
BUN: 20 mg/dL (ref 6–24)
Bilirubin Total: 0.3 mg/dL (ref 0.0–1.2)
CO2: 21 mmol/L (ref 20–29)
Calcium: 9.1 mg/dL (ref 8.7–10.2)
Chloride: 101 mmol/L (ref 96–106)
Creatinine, Ser: 0.86 mg/dL (ref 0.76–1.27)
Globulin, Total: 2.9 g/dL (ref 1.5–4.5)
Glucose: 91 mg/dL (ref 70–99)
Potassium: 4.5 mmol/L (ref 3.5–5.2)
Sodium: 139 mmol/L (ref 134–144)
Total Protein: 7.3 g/dL (ref 6.0–8.5)
eGFR: 104 mL/min/{1.73_m2} (ref >59)

## 2021-02-19 LAB — PSA: Prostate Specific Ag, Serum: 0.6 ng/mL (ref 0.0–4.0)

## 2021-02-19 LAB — SPECIMEN STATUS REPORT

## 2021-03-11 DIAGNOSIS — R739 Hyperglycemia, unspecified: Secondary | ICD-10-CM

## 2021-03-11 HISTORY — DX: Hyperglycemia, unspecified: R73.9

## 2021-03-13 ENCOUNTER — Ambulatory Visit: Payer: Self-pay | Admitting: Internal Medicine

## 2021-04-19 ENCOUNTER — Ambulatory Visit: Payer: Self-pay | Admitting: Internal Medicine

## 2021-04-26 ENCOUNTER — Other Ambulatory Visit: Payer: Self-pay

## 2021-04-26 ENCOUNTER — Encounter: Payer: Self-pay | Admitting: Internal Medicine

## 2021-04-26 ENCOUNTER — Ambulatory Visit: Payer: Self-pay | Admitting: Internal Medicine

## 2021-04-26 VITALS — BP 120/78 | HR 72 | Resp 12 | Ht 67.0 in | Wt 174.0 lb

## 2021-04-26 DIAGNOSIS — R0981 Nasal congestion: Secondary | ICD-10-CM

## 2021-04-26 DIAGNOSIS — Z79899 Other long term (current) drug therapy: Secondary | ICD-10-CM

## 2021-04-26 DIAGNOSIS — R140 Abdominal distension (gaseous): Secondary | ICD-10-CM

## 2021-04-26 DIAGNOSIS — Z23 Encounter for immunization: Secondary | ICD-10-CM

## 2021-04-26 DIAGNOSIS — B351 Tinea unguium: Secondary | ICD-10-CM | POA: Insufficient documentation

## 2021-04-26 LAB — POCT INFLUENZA A/B
Influenza A, POC: NEGATIVE
Influenza B, POC: NEGATIVE

## 2021-04-26 LAB — POC COVID19 BINAXNOW: SARS Coronavirus 2 Ag: NEGATIVE

## 2021-04-26 NOTE — Progress Notes (Signed)
° ° °  Subjective:    Patient ID: Eric Beasley, male   DOB: 1965/09/23, 56 y.o.   MRN: [id number]   HPI  Tildon Husky interprets Had negative COVID and influenza testing with URI symptoms recently.   Toenail onychomycosis:  tolerating Terbinafine well.    2.  Abdominal  Bloating:  not really working that much on eating smaller more frequent meals.  Continues on Metamucil.    Current Meds  Medication Sig   loratadine (CLARITIN) 10 MG tablet Take 1 tablet (10 mg total) by mouth daily.   psyllium (METAMUCIL) 58.6 % packet Take 1 packet by mouth daily.   terbinafine (LAMISIL) 250 MG tablet Take 1 tablet (250 mg total) by mouth daily.    Allergies  Allergen Reactions   Naproxen Rash     Review of Systems    Objective:   BP 120/78 (BP Location: Left Arm, Patient Position: Sitting, Cuff Size: Normal)    Pulse 72    Resp 12    Ht 5\' 7"  (1.702 m)    Wt 174 lb (78.9 kg)    BMI 27.25 kg/m   Physical Exam Right great toenail with good clearing of base.  Small toe nail is possibly clearing at base as well.  Assessment & Plan    Toenail onychomycosis:  improved.  Has 4 more weeks or so left.  Follow up thereafter with hepatic profile and repeat visit.  2.  Abdominal bloating after heavy meals:  encouraged changing eating habits.  Continue Metamucil  3.  HM:  Shingles Vaccine.  Repeat in 2-6 months.

## 2021-04-28 LAB — HEPATIC FUNCTION PANEL
ALT: 42 IU/L (ref 0–44)
AST: 30 IU/L (ref 0–40)
Albumin: 4.5 g/dL (ref 3.8–4.9)
Alkaline Phosphatase: 89 IU/L (ref 44–121)
Bilirubin Total: <0.2 mg/dL (ref 0.0–1.2)
Bilirubin, Direct: <0.1 mg/dL (ref 0.00–0.40)
Total Protein: 7.2 g/dL (ref 6.0–8.5)

## 2021-06-14 ENCOUNTER — Ambulatory Visit: Payer: Self-pay | Admitting: Internal Medicine

## 2021-07-27 ENCOUNTER — Other Ambulatory Visit: Payer: Self-pay

## 2021-10-05 ENCOUNTER — Encounter: Payer: Self-pay | Admitting: Internal Medicine

## 2021-10-05 ENCOUNTER — Ambulatory Visit: Payer: Self-pay | Admitting: Internal Medicine

## 2021-10-05 VITALS — BP 122/78 | HR 68 | Resp 12 | Ht 67.0 in | Wt 174.0 lb

## 2021-10-05 DIAGNOSIS — R1031 Right lower quadrant pain: Secondary | ICD-10-CM

## 2021-10-05 NOTE — Progress Notes (Signed)
    Subjective:    Patient ID: Eric Beasley, male   DOB: 1965-08-27, 56 y.o.   MRN: [id number]   HPI  Eric Beasley interprets:    Right inner thigh pain:  states he had this about 1 month ago.  Now having pain in right groin/lower abdomen.  Pain in thigh lasted for about 1 week and then resolved.  Cannot recall a preceding injury or overuse.  Described the pain as a poking or cramping pain, the latter when stretching the muscle.  States he would massage the area and would go away.   Now, having pain in right mid to lateral inguinal area and right lower quad of abdomen.  Notes pressure pain when stretches the area.  Never notes a bulge.  He describes the RLQ looks bigger than the left.   Only notes the discomfort when stretches or bears down/with physical activity.  Notes he is tender there when palpates. No change in discomfort with passing flatus or BM. No blood in stool or melena. No abnormalities with urination  Current Meds  Medication Sig   loratadine (CLARITIN) 10 MG tablet Take 1 tablet (10 mg total) by mouth daily.   psyllium (METAMUCIL) 58.6 % packet Take 1 packet by mouth daily.   Allergies  Allergen Reactions   Diclofenac    Naproxen Rash     Review of Systems    Objective:   BP 122/78 (BP Location: Right Arm, Patient Position: Sitting, Cuff Size: Normal)   Pulse 68   Resp 12   Ht 5\' 7"  (1.702 m)   Wt 174 lb (78.9 kg)   BMI 27.25 kg/m   Physical Exam NAD Lungs:  CTA CV:  RRR without murmur or rub.  Radial pulses normal and equal Abd:  S, + BS, No HSM or mass.  No palpable hernial opening or hernia, even with bearing down, but tender in RLQ/inguinal area.   GU:  no scrotal or testicular mass.   Assessment & Plan   Right inguinal area pain:  Unable to define clearly if hernia in area.  With his history, feel he needs evaluation by Gen Surgery.  Needs expedited orange card.  Will ask D , CHW to support his application process.

## 2022-02-01 ENCOUNTER — Encounter: Payer: Self-pay | Admitting: Internal Medicine

## 2022-02-08 ENCOUNTER — Ambulatory Visit: Payer: Self-pay | Admitting: Internal Medicine

## 2022-02-08 ENCOUNTER — Encounter: Payer: Self-pay | Admitting: Internal Medicine

## 2022-02-08 VITALS — BP 120/80 | HR 72 | Resp 20 | Ht 67.0 in | Wt 172.0 lb

## 2022-02-08 DIAGNOSIS — Z125 Encounter for screening for malignant neoplasm of prostate: Secondary | ICD-10-CM

## 2022-02-08 DIAGNOSIS — Z114 Encounter for screening for human immunodeficiency virus [HIV]: Secondary | ICD-10-CM

## 2022-02-08 DIAGNOSIS — L819 Disorder of pigmentation, unspecified: Secondary | ICD-10-CM

## 2022-02-08 DIAGNOSIS — Z0000 Encounter for general adult medical examination without abnormal findings: Secondary | ICD-10-CM

## 2022-02-08 DIAGNOSIS — H547 Unspecified visual loss: Secondary | ICD-10-CM

## 2022-02-08 DIAGNOSIS — E782 Mixed hyperlipidemia: Secondary | ICD-10-CM

## 2022-02-08 DIAGNOSIS — Z1159 Encounter for screening for other viral diseases: Secondary | ICD-10-CM

## 2022-02-08 DIAGNOSIS — E785 Hyperlipidemia, unspecified: Secondary | ICD-10-CM | POA: Insufficient documentation

## 2022-02-08 DIAGNOSIS — Z9189 Other specified personal risk factors, not elsewhere classified: Secondary | ICD-10-CM

## 2022-02-08 DIAGNOSIS — Z23 Encounter for immunization: Secondary | ICD-10-CM

## 2022-02-08 MED ORDER — FLUTICASONE PROPIONATE 50 MCG/ACT NA SUSP: 2.0000 | Freq: Every day | NASAL | 6 refills | Status: DC

## 2022-02-08 MED ORDER — LEVOCETIRIZINE DIHYDROCHLORIDE 5 MG PO TABS: 5.0000 mg | ORAL_TABLET | Freq: Every evening | ORAL | Status: DC

## 2022-02-08 NOTE — Progress Notes (Signed)
Subjective:    Patient ID: [id number] Eric Beasley, male   DOB: 07/10/1965, 56 y.o.   MRN: [id number]   HPI  Eric Beasley interprets  Here for Male CPE:  1.  STE:  Does perform once monthly in shower.  No findings.  No family history of testicular cancer.    2.  PSA:  Normal 01/22/21.  No family history of prostate cancer.   3.  Guaiac Cards/FIT:  Has never returned.    4.  Colonoscopy:   Never.  No family history of colon cancer.    5.  Cholesterol/Glucose:  Cholesterol improved last year, though total still a bit high and good cholesterol low.  Glucose fine in past. Lipid Panel     Component Value Date/Time   CHOL 201 (H) 01/22/2021 0900   TRIG 192 (H) 01/22/2021 0900   HDL 38 (L) 01/22/2021 0900   LDLCALC 129 (H) 01/22/2021 0900   LABVLDL 34 01/22/2021 0900     6.  Immunizations:  He is not certain, but thinks he received Td 6 years ago.  Needs second Shingrix vaccine, but we are currently without.   Has not had influenza nor COVID booster. Immunization History  Administered Date(s) Administered   Covid-19, Mrna,Vaccine(Spikevax)36yrs and older 02/08/2022   Influenza Inj Mdck Quad Pf 02/05/2021   MODERNA COVID-19 SARS-COV-2 PEDS BIVALENT BOOSTER 6Y-11Y 05/24/2019, 06/21/2019   Moderna SARS-COV2 Booster Vaccination 04/03/2020   Pfizer Covid-19 Vaccine Bivalent Booster 43yrs & up 02/05/2021   Tdap 02/08/2022   Zoster Recombinat (Shingrix) 04/26/2021     Current Meds  Medication Sig   loratadine (CLARITIN) 10 MG tablet Take 1 tablet (10 mg total) by mouth daily.   psyllium (METAMUCIL) 58.6 % packet Take 1 packet by mouth daily.   Allergies  Allergen Reactions   Diclofenac Other (See Comments)    Abdominal pain   Naproxen Rash   Past Medical History:  Diagnosis Date   Environmental and seasonal allergies    GERD (gastroesophageal reflux disease) 2017   Hyperlipidemia    History reviewed. No pertinent surgical history.  Family History  Problem  Relation Age of Onset   Hypertension Mother    Cirrhosis Father    Alcohol abuse Father    Social History   Socioeconomic History   Marital status: Married    Spouse name: Eric Beasley   Number of children: 3   Years of education: 6   Highest education level: 6th grade  Occupational History   Occupation: Copywriter, advertising  Tobacco Use   Smoking status: Never    Passive exposure: Never   Smokeless tobacco: Never  Vaping Use   Vaping Use: Never used  Substance and Sexual Activity   Alcohol use: Yes    Comment: Rare   Drug use: No   Sexual activity: Yes    Birth control/protection: None  Other Topics Concern   Not on file  Social History Narrative   Lives with his wife and a friend   His 2 children with current wife live in [place] as does his oldest daughter.   Social Determinants of Health   Financial Resource Strain: Low Risk  (02/08/2022)   Overall Financial Resource Strain (CARDIA)    Difficulty of Paying Living Expenses: Not hard at all  Food Insecurity: No Food Insecurity (02/08/2022)   Hunger Vital Sign    Worried About Running Out of Food in the Last Year: Never true    Ran Out of Food in the Last Year: Never  true  Transportation Needs: No Transportation Needs (02/08/2022)   PRAPARE - Administrator, Civil Service (Medical): No    Lack of Transportation (Non-Medical): No  Physical Activity: Not on file  Stress: Not on file  Social Connections: Not on file  Intimate Partner Violence: Not At Risk (01/26/2021)   Humiliation, Afraid, Rape, and Kick questionnaire    Fear of Current or Ex-Partner: No    Emotionally Abused: No    Physically Abused: No    Sexually Abused: No      Review of Systems  Eyes:  Positive for visual disturbance (Vision blurry.  Thinks he needs glasses.).  Respiratory:  Negative for shortness of breath.   Cardiovascular:  Negative for chest pain, palpitations and leg swelling.  Gastrointestinal:  Negative for abdominal  pain (Metamucil helping a lot.  No abdominal discomfort and yet able to eat a lot.).  Genitourinary:        Irritation of glans beneath foreskin.  No itching or odor.  No penile discharge.  No wetness or discharge or plaqueing to area.   Notes when has other symptoms of allergies in particular:  Itching or eyes and nose, skin.  No runny nose or sneezing.  Irritated throat.  Always with some allergy symptoms.   Taking Claritin 10 mg daily.  Not clear helps a lot.  Notes an odor in a car he purchased has caused him more symptoms, even after thorough cleaning and planning to sell.  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      Objective:   BP 120/80 (BP Location: Right Arm, Patient Position: Sitting, Cuff Size: Normal)   Pulse 72   Resp 20   Ht 5\' 7"  (1.702 m)   Wt 172 lb (78 kg)   BMI 26.94 kg/m   Physical Exam HENT:     Head: Normocephalic and atraumatic.     Right Ear: Tympanic membrane, ear canal and external ear normal.     Left Ear: Tympanic membrane, ear canal and external ear normal.     Nose: Mucosal edema and rhinorrhea (clear) present.     Mouth/Throat:     Mouth: Mucous membranes are moist.     Pharynx: Oropharynx is clear.  Eyes:     Extraocular Movements: Extraocular movements intact.     Conjunctiva/sclera: Conjunctivae normal.     Pupils: Pupils are equal, round, and reactive to light.     Comments: Discs sharp bilaterally.   No conjunctival injection.  Neck:     Thyroid: No thyroid mass or thyromegaly.  Cardiovascular:     Rate and Rhythm: Normal rate and regular rhythm.     Heart sounds: S1 normal and S2 normal. No murmur heard.    No friction rub. No S3 or S4 sounds.     Comments: No carotid bruits.  Carotid, radial, femoral, DP and PT pulses normal and equal.   Pulmonary:     Effort: Pulmonary effort is normal.     Breath sounds: Normal breath sounds and air entry.  Abdominal:     General: Bowel  sounds are normal.     Palpations: Abdomen is soft. There is no hepatomegaly, splenomegaly or mass.     Tenderness: There is no abdominal tenderness.     Hernia: No hernia is present.  Genitourinary:    Penis: Uncircumcised.      Testes:        Right: Mass  or tenderness not present. Right testis is descended.        Left: Mass or tenderness not present. Left testis is descended.     Comments: Normal color of skin of external foreskin, but portion folding back against glans is white as is the glans, both circumferentially.  The skin on dorsal glans also appears thickened.   Musculoskeletal:        General: Normal range of motion.     Cervical back: Normal range of motion and neck supple.     Right lower leg: No edema.     Left lower leg: No edema.  Lymphadenopathy:     Head:     Right side of head: No submental or submandibular adenopathy.     Left side of head: No submental or submandibular adenopathy.     Cervical: No cervical adenopathy.     Upper Body:     Right upper body: No supraclavicular adenopathy.     Left upper body: No supraclavicular adenopathy.     Lower Body: No right inguinal adenopathy. No left inguinal adenopathy.  Skin:    General: Skin is warm.     Capillary Refill: Capillary refill takes less than 2 seconds.     Findings: No rash.  Neurological:     General: No focal deficit present.     Mental Status: He is alert and oriented to person, place, and time.     Cranial Nerves: Cranial nerves 2-12 are intact.     Sensory: Sensation is intact.     Motor: Motor function is intact.     Coordination: Coordination is intact.     Gait: Gait is intact.     Deep Tendon Reflexes: Reflexes are normal and symmetric.  Psychiatric:        Attention and Perception: Attention normal.        Behavior: Behavior normal. Behavior is cooperative.      Assessment & Plan    CPE CBC, CMP, FLP, Hep C, HIV, PSA Spikevax COVID booster. Tdap  2.  Allergies:  switch to Xyzal 5  mg daily and Flonase 2 sprays each nostril daily.  3.  GI:  abdominal discomfort resolved with regular use of Metamucil.  Cancel Surgery referral.  4.  Hyperlipidemia:  labs  5.  Decreased visual acuity:  optometry referral.  6.  Loss of pigment with concern for thickening of some areas of pigment loss on glans/foreskin:  dermatology referral.  Referral to [facility] for quicker derm referral to [facility] and financial assistance app.

## 2022-02-09 LAB — CBC WITH DIFFERENTIAL/PLATELET
Basophils Absolute: 0.1 10*3/uL (ref 0.0–0.2)
Basos: 2 %
EOS (ABSOLUTE): 0.1 10*3/uL (ref 0.0–0.4)
Eos: 3 %
Hematocrit: 42.3 % (ref 37.5–51.0)
Hemoglobin: 14.8 g/dL (ref 13.0–17.7)
Immature Grans (Abs): 0 10*3/uL (ref 0.0–0.1)
Immature Granulocytes: 1 %
Lymphocytes Absolute: 1.5 10*3/uL (ref 0.7–3.1)
Lymphs: 36 %
MCH: 30.6 pg (ref 26.6–33.0)
MCHC: 35 g/dL (ref 31.5–35.7)
MCV: 87 fL (ref 79–97)
Monocytes Absolute: 0.3 10*3/uL (ref 0.1–0.9)
Monocytes: 8 %
Neutrophils Absolute: 2.1 10*3/uL (ref 1.4–7.0)
Neutrophils: 50 %
Platelets: 274 10*3/uL (ref 150–450)
RBC: 4.84 x10E6/uL (ref 4.14–5.80)
RDW: 12.6 % (ref 11.6–15.4)
WBC: 4.1 10*3/uL (ref 3.4–10.8)

## 2022-02-09 LAB — LIPID PANEL W/O CHOL/HDL RATIO
Cholesterol, Total: 220 mg/dL — ABNORMAL HIGH (ref 100–199)
HDL: 38 mg/dL — ABNORMAL LOW (ref >39)
LDL Chol Calc (NIH): 131 mg/dL — ABNORMAL HIGH (ref 0–99)
Triglycerides: 288 mg/dL — ABNORMAL HIGH (ref 0–149)
VLDL Cholesterol Cal: 51 mg/dL — ABNORMAL HIGH (ref 5–40)

## 2022-02-09 LAB — COMPREHENSIVE METABOLIC PANEL
ALT: 29 IU/L (ref 0–44)
AST: 21 IU/L (ref 0–40)
Albumin/Globulin Ratio: 2.1 (ref 1.2–2.2)
Albumin: 4.8 g/dL (ref 3.8–4.9)
Alkaline Phosphatase: 77 IU/L (ref 44–121)
BUN/Creatinine Ratio: 18 (ref 9–20)
BUN: 15 mg/dL (ref 6–24)
Bilirubin Total: 0.5 mg/dL (ref 0.0–1.2)
CO2: 25 mmol/L (ref 20–29)
Calcium: 9.3 mg/dL (ref 8.7–10.2)
Chloride: 101 mmol/L (ref 96–106)
Creatinine, Ser: 0.85 mg/dL (ref 0.76–1.27)
Globulin, Total: 2.3 g/dL (ref 1.5–4.5)
Glucose: 103 mg/dL — ABNORMAL HIGH (ref 70–99)
Potassium: 4.5 mmol/L (ref 3.5–5.2)
Sodium: 139 mmol/L (ref 134–144)
Total Protein: 7.1 g/dL (ref 6.0–8.5)
eGFR: 103 mL/min/{1.73_m2} (ref >59)

## 2022-02-09 LAB — HIV ANTIBODY (ROUTINE TESTING W REFLEX): HIV Screen 4th Generation wRfx: NONREACTIVE

## 2022-02-09 LAB — PSA: Prostate Specific Ag, Serum: 0.8 ng/mL (ref 0.0–4.0)

## 2022-02-09 LAB — HEPATITIS C ANTIBODY: Hep C Virus Ab: NONREACTIVE

## 2022-02-15 ENCOUNTER — Other Ambulatory Visit: Payer: Self-pay

## 2022-02-15 DIAGNOSIS — Z1211 Encounter for screening for malignant neoplasm of colon: Secondary | ICD-10-CM

## 2022-02-15 LAB — POC FIT TEST STOOL: Fecal Occult Blood: NEGATIVE

## 2022-02-18 ENCOUNTER — Telehealth: Payer: Self-pay

## 2022-02-18 NOTE — Telephone Encounter (Signed)
Patient would like an appointment for headaches related to stress.

## 2022-02-21 ENCOUNTER — Ambulatory Visit: Payer: Self-pay | Admitting: Internal Medicine

## 2022-02-21 ENCOUNTER — Encounter: Payer: Self-pay | Admitting: Internal Medicine

## 2022-02-21 VITALS — BP 110/70 | HR 76 | Resp 12 | Ht 67.0 in | Wt 176.0 lb

## 2022-02-21 DIAGNOSIS — J3089 Other allergic rhinitis: Secondary | ICD-10-CM

## 2022-02-21 DIAGNOSIS — G44209 Tension-type headache, unspecified, not intractable: Secondary | ICD-10-CM

## 2022-02-21 MED ORDER — MOMETASONE FUROATE 50 MCG/ACT NA SUSP: NASAL | 12 refills | Status: AC

## 2022-02-21 NOTE — Progress Notes (Signed)
    Subjective:    Patient ID: Eric Beasley, male   DOB: 03-01-1966, 56 y.o.   MRN: [id number]   HPI  Eric Beasley interprets   Allergies:  the Xyzal was causing dryness of mouth, eyes and nasal area and he feels was causing headaches.  States took the Xyzal for 3 days and stopped.  Continued the Flonase for 3 more days and did not like the way it made his throat feel, so stopped.  Also felt anxious when using meds.  Dryness, headache and anxiety went away with stopping both meds.  2.  Left ear pain and allergy symptoms now.  Itchy eyes, dry lips.    3.  Another HA:  Had left sided HA for which he was seen by another provider yesterday.  Was given a muscle relaxant and told to take Tylenol 650 mg every 5 hours per patient.  Has taken 2 doses and pain is relieved thus far.  States was very stressed at time of HA with job.  Has not had a headache like this in 20 years.     Current Meds  Medication Sig   psyllium (METAMUCIL) 58.6 % packet Take 1 packet by mouth daily.   Allergies  Allergen Reactions   Diclofenac Other (See Comments)    Abdominal pain   Naproxen Rash     Review of Systems    Objective:   BP 110/70 (BP Location: Right Arm, Patient Position: Sitting, Cuff Size: Normal)   Pulse 76   Resp 12   Ht 5\' 7"  (1.702 m)   Wt 176 lb (79.8 kg)   BMI 27.57 kg/m   Physical Exam NAD HEENT:  PERRL, EOMI, Discs sharp bilaterlly.  No conjunctival injection.  TMs dull, but without injection,  Not able to see posterior pharynx well.   NT over frontal and Maxillary sinuses. Neck:  Supple, No adenopathy Chest:  CTA CV:  RRR without murmur or rub.  Radial and DP pulses normal and equal Neuro:  A & O x 3, CN II-XII grossly intact.  Motor 5/5.  Gait normal.    Assessment & Plan    Allergies with side effects from Flonase and Xyzal.  Will try Nasonex at a lower dose 1 spray each nostril daily and see if gets improvement with allergies without significant side  effect.  Discussed to call if fever, increased ear pain, but no infection of TMs currently.  Eustachian tube dysfunction.  Also discussed the dryness from allergy meds will likely be a side effect with all of the antihistamines.  2.  Right sided HA:  As he has had relief with Tylenol, would just continue to use that as needed.  He is currently asymptomatic with this HA and sounds like related to muscle tension.  To use muscle relaxer only if unable to sleep with HA pain.

## 2022-02-22 NOTE — Telephone Encounter (Signed)
PT was seen 02/21/22

## 2022-04-18 ENCOUNTER — Ambulatory Visit (INDEPENDENT_AMBULATORY_CARE_PROVIDER_SITE_OTHER): Payer: Self-pay | Admitting: Internal Medicine

## 2022-04-18 DIAGNOSIS — Z23 Encounter for immunization: Secondary | ICD-10-CM

## 2022-07-15 ENCOUNTER — Telehealth: Payer: Self-pay

## 2022-07-15 NOTE — Telephone Encounter (Signed)
Patient called to request an appointment for stomach irritation. Has had this problem for about a week. He took Pepto which helped some. Would like an appointment if an appointment becomes available in the afternoon.

## 2022-08-15 NOTE — Telephone Encounter (Signed)
Patient has been schedule.

## 2022-08-15 NOTE — Telephone Encounter (Signed)
Patient has been scheduled

## 2022-08-19 ENCOUNTER — Encounter: Payer: Self-pay | Admitting: Internal Medicine

## 2022-08-19 ENCOUNTER — Ambulatory Visit: Payer: Self-pay | Admitting: Internal Medicine

## 2022-08-19 VITALS — BP 116/76 | HR 63 | Resp 12 | Ht 67.0 in | Wt 181.0 lb

## 2022-08-19 DIAGNOSIS — R1032 Left lower quadrant pain: Secondary | ICD-10-CM

## 2022-08-19 DIAGNOSIS — E782 Mixed hyperlipidemia: Secondary | ICD-10-CM

## 2022-08-19 NOTE — Patient Instructions (Signed)

## 2022-08-19 NOTE — Progress Notes (Signed)
    Subjective:    Patient ID: Eric Beasley, male   DOB: March 28, 1965, 57 y.o.   MRN: [id number]   HPI  Eric Beasley interprets     Abdominal discomfort:  Describes a mild burning in his left mid-abdomen associated with a lot of bowel noise from entire abdomen.  Started about 1 1/2 months ago.  Feels this started after he started using too much fiber:  metamucil/kiwi, other fruit.   States he seems to get this only after passing a BM.   Lasts about 1 hour.  Has once daily after a BM, but not after every BM. No relations to eating. No change to BMs No constipation or diarrhea.   No blood in stool or black sticky stool. No associated nausea or vomiting.   No weight loss--has in fact, gained about 10 lbs. No fever.   Has tried pepto bismal with definite improvement.   History of epigastric bloating and pain after eating in 2022.  At first, he states this is the same pain, but then clarifies it is different. Has had other abdominal complaints in the past.     Current Meds  Medication Sig   mometasone (NASONEX) 50 MCG/ACT nasal spray 1 spray each nostril daily   psyllium (METAMUCIL) 58.6 % packet Take 1 packet by mouth daily.   Allergies  Allergen Reactions   Diclofenac Other (See Comments)    Abdominal pain   Naproxen Rash     Review of Systems    Objective:   BP 116/76 (BP Location: Right Arm, Patient Position: Sitting, Cuff Size: Normal)   Pulse 63   Resp 12   Ht 5\' 7"  (1.702 m)   Wt 181 lb (82.1 kg)   BMI 28.35 kg/m   Physical Exam NAD Lungs: CTA CV:  RRR without murmur or rub. Radial pulses normal and equal Abd:  S, NT, No HSM or mass, + BS LE:  No edema   Assessment & Plan  Abdominal pain:  FIT repeat to get in in 2 weeks.  He wants to try stopping the metamucil and see if helps.   He will call progress report in 1 month. He will call if symptoms worsen or new complaints.  Hyperlipidemia:  encouraged him to work on diet and physical  activity, especially with 10 lb weight gain.

## 2023-02-10 ENCOUNTER — Ambulatory Visit: Payer: Self-pay | Admitting: Internal Medicine

## 2023-02-10 ENCOUNTER — Encounter: Payer: Self-pay | Admitting: Internal Medicine

## 2023-02-10 VITALS — BP 122/78 | HR 68 | Resp 16 | Ht 67.0 in | Wt 182.0 lb

## 2023-02-10 DIAGNOSIS — Z125 Encounter for screening for malignant neoplasm of prostate: Secondary | ICD-10-CM

## 2023-02-10 DIAGNOSIS — K5900 Constipation, unspecified: Secondary | ICD-10-CM

## 2023-02-10 DIAGNOSIS — Z0000 Encounter for general adult medical examination without abnormal findings: Secondary | ICD-10-CM

## 2023-02-10 DIAGNOSIS — B351 Tinea unguium: Secondary | ICD-10-CM

## 2023-02-10 DIAGNOSIS — R739 Hyperglycemia, unspecified: Secondary | ICD-10-CM

## 2023-02-10 DIAGNOSIS — E782 Mixed hyperlipidemia: Secondary | ICD-10-CM

## 2023-02-10 MED ORDER — TERBINAFINE HCL 250 MG PO TABS: 250.0000 mg | ORAL_TABLET | Freq: Every day | ORAL | 0 refills | Status: AC

## 2023-02-10 NOTE — Progress Notes (Signed)
Subjective:    Patient ID: Eric Beasley, male   DOB: 11-01-1965, 57 y.o.   MRN: [id number]   HPI  Here for Male CPE:  1.  STE:  Does perform.  No family history of testicular cancer.    2.  PSA:   Last checked 02/2022 and normal at 0.8.  No family history of prostate cancer.   3.  Guaiac Cards/FIT:  Negative in 02/2022  4.  Colonoscopy:  Never.  No family history of colon cancer.    5.  Cholesterol/Glucose:  History of hyperlipidemia and mild hyperglycemia in 2023.   Lipid Panel     Component Value Date/Time   CHOL 220 (H) 02/08/2022 1003   TRIG 288 (H) 02/08/2022 1003   HDL 38 (L) 02/08/2022 1003   LDLCALC 131 (H) 02/08/2022 1003   LABVLDL 51 (H) 02/08/2022 1003     6.  Immunizations:  Has not had influenza or covid booster this past fall.   Immunization History  Administered Date(s) Administered   Influenza Inj Mdck Quad Pf 02/05/2021   MODERNA COVID-19 SARS-COV-2 PEDS BIVALENT BOOSTER 69yr-78yr 05/24/2019, 06/21/2019   Moderna Covid-19 Fall Seasonal Vaccine 31yrs & older 02/08/2022   Moderna SARS-COV2 Booster Vaccination 04/03/2020   Pfizer Covid-19 Vaccine Bivalent Booster 71yrs & up 02/05/2021   Tdap 02/08/2022   Zoster Recombinant(Shingrix) 04/26/2021, 04/18/2022     Current Meds  Medication Sig   loratadine (CLARITIN) 10 MG tablet Take 10 mg by mouth daily.   psyllium (METAMUCIL) 58.6 % packet Take 1 packet by mouth daily.   Allergies  Allergen Reactions   Diclofenac Other (See Comments)    Abdominal pain   Naproxen Rash   Past Medical History:  Diagnosis Date   Constipation    Environmental and seasonal allergies    GERD (gastroesophageal reflux disease) 2017   Hyperglycemia 2023   Hyperlipidemia    History reviewed. No pertinent surgical history.  Family History  Problem Relation Age of Onset   Hypertension Mother    Cirrhosis Father    Alcohol abuse Father    Social History   Socioeconomic History   Marital status:  Married    Spouse name: Conception Rosas   Number of children: 3   Years of education: 6   Highest education level: 6th grade  Occupational History   Occupation: IT consultant  Tobacco Use   Smoking status: Never    Passive exposure: Never   Smokeless tobacco: Never  Vaping Use   Vaping status: Never Used  Substance and Sexual Activity   Alcohol use: Yes    Comment: Rare   Drug use: No   Sexual activity: Yes    Birth control/protection: None  Other Topics Concern   Not on file  Social History Narrative   Lives with his wife and a friend   Has 2 children with current wife live in [place] as does his oldest daughter, from previous marriage.   Social Determinants of Health   Financial Resource Strain: Low Risk  (02/10/2023)   Overall Financial Resource Strain (CARDIA)    Difficulty of Paying Living Expenses: Not very hard  Food Insecurity: No Food Insecurity (02/10/2023)   Hunger Vital Sign    Worried About Running Out of Food in the Last Year: Never true    Ran Out of Food in the Last Year: Never true  Transportation Needs: No Transportation Needs (02/10/2023)   PRAPARE - Administrator, Civil Service (Medical): No  Lack of Transportation (Non-Medical): No  Physical Activity: Not on file  Stress: Not on file  Social Connections: Not on file  Intimate Partner Violence: Not At Risk (02/10/2023)   Humiliation, Afraid, Rape, and Kick questionnaire    Fear of Current or Ex-Partner: No    Emotionally Abused: No    Physically Abused: No    Sexually Abused: No      Review of Systems  HENT:  Negative for dental problem.   Eyes:  Positive for visual disturbance (some decreased acuity.  Has not gone to eye doctor.).  Respiratory:  Negative for shortness of breath.   Cardiovascular:  Negative for chest pain, palpitations and leg swelling.  Gastrointestinal:  Negative for abdominal pain and blood in stool (No melena).       Brings up taking Omeprazole  intermittently, perhaps once monthly,for what sounds like constipation.  Discussed that is what his metamucil is for, which he is taking.   Uses olive oil also, which he states helps.   Does describe hearing a lot of gas noise after eating.  Sounds like stomach growling.  No associated discomfort, diarrhea.  Does not happen when constipated as well.     Skin:        Left great toenail onychomycosis never completely resolved with 90 day treatment with Terbinafine.        Objective:   BP 122/78 (BP Location: Right Arm, Patient Position: Sitting, Cuff Size: Normal)   Pulse 68   Resp 16   Ht 5\' 7"  (1.702 m)   Wt 182 lb (82.6 kg)   BMI 28.51 kg/m   Physical Exam HENT:     Head: Normocephalic and atraumatic.     Right Ear: Tympanic membrane, ear canal and external ear normal.     Left Ear: Tympanic membrane, ear canal and external ear normal.     Nose: Nose normal.     Mouth/Throat:     Mouth: Mucous membranes are moist.     Pharynx: Oropharynx is clear.  Eyes:     Extraocular Movements: Extraocular movements intact.     Conjunctiva/sclera: Conjunctivae normal.     Pupils: Pupils are equal, round, and reactive to light.     Comments: Discs sharp  Neck:     Thyroid: No thyroid mass or thyromegaly.  Cardiovascular:     Rate and Rhythm: Normal rate and regular rhythm.     Heart sounds: S1 normal and S2 normal. No murmur heard.    No friction rub. No S3 or S4 sounds.     Comments: No carotid bruits.  Carotid, radial, femoral, DP and PT pulses normal and equal.   Pulmonary:     Effort: Pulmonary effort is normal.     Breath sounds: Normal breath sounds and air entry.  Abdominal:     General: Bowel sounds are normal.     Palpations: Abdomen is soft. There is no hepatomegaly, splenomegaly or mass.     Tenderness: There is no abdominal tenderness.     Hernia: No hernia is present.  Genitourinary:    Penis: Normal and uncircumcised.      Testes:        Right: Mass or tenderness  not present. Right testis is descended.        Left: Mass or tenderness not present. Left testis is descended.  Musculoskeletal:        General: Normal range of motion.     Cervical back: Normal range of motion and neck  supple.     Right lower leg: No edema.     Left lower leg: No edema.       Feet:  Feet:     Left foot:     Toenail Condition: Fungal disease present. Skin:    General: Skin is warm.     Capillary Refill: Capillary refill takes less than 2 seconds.  Neurological:     General: No focal deficit present.     Mental Status: He is alert and oriented to person, place, and time.  Psychiatric:        Behavior: Behavior normal.      Assessment & Plan   CPE  Encouraged COVID and Flu--currently out and will put him down for call back FIT to return in 2 weeks.  2.  Hyperglycemia:  A1C.  3. Hyperlipidemia:  FLP.  Discussed improving diet and regular physical activity.    4.  Left great toenail onychomycosis:  retreat with Terbinafine.  CmP.  Encouraged him to follow through with shoe and shower floor care during and after treatment.

## 2023-02-10 NOTE — Patient Instructions (Signed)
For foot and toenail fungus:  Spray your shoes with Lysol or Lotrimin Antifungal spray when you start the medication for your feet.   Re-spray your the shoes you wear with each wearing and allow to dry before wearing again You will need to continue to spray the shoes you have during the infection of your toenails and feet have been thrown out due to wear.  Once your toenails and feet are clear of infection, the shoes you buy new do not necessarily need to be sprayed. Clean your shower floor once to twice daily with bleach containing cleaner. 

## 2023-02-11 LAB — CBC WITH DIFFERENTIAL/PLATELET
Basophils Absolute: 0.1 10*3/uL (ref 0.0–0.2)
Basos: 2 %
EOS (ABSOLUTE): 0.1 10*3/uL (ref 0.0–0.4)
Eos: 3 %
Hematocrit: 45.7 % (ref 37.5–51.0)
Hemoglobin: 15.1 g/dL (ref 13.0–17.7)
Immature Grans (Abs): 0 10*3/uL (ref 0.0–0.1)
Immature Granulocytes: 1 %
Lymphocytes Absolute: 1.6 10*3/uL (ref 0.7–3.1)
Lymphs: 34 %
MCH: 30.1 pg (ref 26.6–33.0)
MCHC: 33 g/dL (ref 31.5–35.7)
MCV: 91 fL (ref 79–97)
Monocytes Absolute: 0.4 10*3/uL (ref 0.1–0.9)
Monocytes: 7 %
Neutrophils Absolute: 2.5 10*3/uL (ref 1.4–7.0)
Neutrophils: 53 %
Platelets: 273 10*3/uL (ref 150–450)
RBC: 5.01 x10E6/uL (ref 4.14–5.80)
RDW: 13.1 % (ref 11.6–15.4)
WBC: 4.8 10*3/uL (ref 3.4–10.8)

## 2023-02-11 LAB — COMPREHENSIVE METABOLIC PANEL
ALT: 32 [IU]/L (ref 0–44)
AST: 21 [IU]/L (ref 0–40)
Albumin: 4.6 g/dL (ref 3.8–4.9)
Alkaline Phosphatase: 73 [IU]/L (ref 44–121)
BUN/Creatinine Ratio: 13 (ref 9–20)
BUN: 10 mg/dL (ref 6–24)
Bilirubin Total: 0.4 mg/dL (ref 0.0–1.2)
CO2: 26 mmol/L (ref 20–29)
Calcium: 9.5 mg/dL (ref 8.7–10.2)
Chloride: 100 mmol/L (ref 96–106)
Creatinine, Ser: 0.8 mg/dL (ref 0.76–1.27)
Globulin, Total: 2.8 g/dL (ref 1.5–4.5)
Glucose: 95 mg/dL (ref 70–99)
Potassium: 3.8 mmol/L (ref 3.5–5.2)
Sodium: 138 mmol/L (ref 134–144)
Total Protein: 7.4 g/dL (ref 6.0–8.5)
eGFR: 105 mL/min/{1.73_m2} (ref >59)

## 2023-02-11 LAB — LIPID PANEL W/O CHOL/HDL RATIO
Cholesterol, Total: 299 mg/dL — ABNORMAL HIGH (ref 100–199)
HDL: 31 mg/dL — ABNORMAL LOW (ref >39)
Triglycerides: 926 mg/dL (ref 0–149)

## 2023-02-11 LAB — PSA: Prostate Specific Ag, Serum: 1.3 ng/mL (ref 0.0–4.0)

## 2023-02-11 LAB — HGB A1C W/O EAG: Hgb A1c MFr Bld: 5.6 % (ref 4.8–5.6)

## 2023-02-25 ENCOUNTER — Other Ambulatory Visit: Payer: Self-pay

## 2023-02-25 DIAGNOSIS — E782 Mixed hyperlipidemia: Secondary | ICD-10-CM

## 2023-02-26 LAB — LIPID PANEL W/O CHOL/HDL RATIO
Cholesterol, Total: 229 mg/dL — ABNORMAL HIGH (ref 100–199)
HDL: 32 mg/dL — ABNORMAL LOW (ref >39)
LDL Chol Calc (NIH): 107 mg/dL — ABNORMAL HIGH (ref 0–99)
Triglycerides: 523 mg/dL — ABNORMAL HIGH (ref 0–149)
VLDL Cholesterol Cal: 90 mg/dL — ABNORMAL HIGH (ref 5–40)

## 2023-05-05 ENCOUNTER — Other Ambulatory Visit: Payer: Self-pay

## 2023-05-05 DIAGNOSIS — E782 Mixed hyperlipidemia: Secondary | ICD-10-CM

## 2023-05-05 DIAGNOSIS — B351 Tinea unguium: Secondary | ICD-10-CM

## 2023-05-06 LAB — HEPATIC FUNCTION PANEL
ALT: 29 [IU]/L (ref 0–44)
AST: 19 [IU]/L (ref 0–40)
Albumin: 4.5 g/dL (ref 3.8–4.9)
Alkaline Phosphatase: 77 [IU]/L (ref 44–121)
Bilirubin Total: 0.3 mg/dL (ref 0.0–1.2)
Bilirubin, Direct: 0.09 mg/dL (ref 0.00–0.40)
Total Protein: 6.8 g/dL (ref 6.0–8.5)

## 2023-05-06 LAB — LIPID PANEL W/O CHOL/HDL RATIO
Cholesterol, Total: 263 mg/dL — ABNORMAL HIGH (ref 100–199)
HDL: 28 mg/dL — ABNORMAL LOW (ref >39)
Triglycerides: 842 mg/dL (ref 0–149)

## 2023-05-26 MED ORDER — FENOFIBRATE 160 MG PO TABS: ORAL_TABLET | ORAL | 11 refills | Status: DC

## 2023-05-26 NOTE — Addendum Note (Signed)
 Addended by: Marcene Duos on: 05/26/2023 04:03 PM   Modules accepted: Orders

## 2023-07-15 ENCOUNTER — Other Ambulatory Visit: Payer: Self-pay

## 2023-07-15 DIAGNOSIS — Z1159 Encounter for screening for other viral diseases: Secondary | ICD-10-CM

## 2023-07-15 DIAGNOSIS — E782 Mixed hyperlipidemia: Secondary | ICD-10-CM

## 2023-07-16 LAB — HEPATIC FUNCTION PANEL
ALT: 29 IU/L (ref 0–44)
AST: 24 IU/L (ref 0–40)
Albumin: 4.5 g/dL (ref 3.8–4.9)
Alkaline Phosphatase: 56 IU/L (ref 44–121)
Bilirubin Total: 0.3 mg/dL (ref 0.0–1.2)
Bilirubin, Direct: 0.12 mg/dL (ref 0.00–0.40)
Total Protein: 6.9 g/dL (ref 6.0–8.5)

## 2023-07-16 LAB — LIPID PANEL W/O CHOL/HDL RATIO
Cholesterol, Total: 183 mg/dL (ref 100–199)
HDL: 37 mg/dL — ABNORMAL LOW (ref >39)
LDL Chol Calc (NIH): 110 mg/dL — ABNORMAL HIGH (ref 0–99)
Triglycerides: 209 mg/dL — ABNORMAL HIGH (ref 0–149)
VLDL Cholesterol Cal: 36 mg/dL (ref 5–40)

## 2023-07-22 ENCOUNTER — Ambulatory Visit: Payer: Self-pay

## 2024-02-10 ENCOUNTER — Other Ambulatory Visit: Payer: Self-pay

## 2024-02-13 ENCOUNTER — Other Ambulatory Visit: Payer: Self-pay

## 2024-02-13 DIAGNOSIS — Z0000 Encounter for general adult medical examination without abnormal findings: Secondary | ICD-10-CM

## 2024-02-14 LAB — CBC WITH DIFFERENTIAL/PLATELET
Basophils Absolute: 0.1 x10E3/uL (ref 0.0–0.2)
Basos: 2 %
EOS (ABSOLUTE): 0.1 x10E3/uL (ref 0.0–0.4)
Eos: 3 %
Hematocrit: 45.4 % (ref 37.5–51.0)
Hemoglobin: 15 g/dL (ref 13.0–17.7)
Immature Grans (Abs): 0 x10E3/uL (ref 0.0–0.1)
Immature Granulocytes: 0 %
Lymphocytes Absolute: 1.4 x10E3/uL (ref 0.7–3.1)
Lymphs: 32 %
MCH: 30.1 pg (ref 26.6–33.0)
MCHC: 33 g/dL (ref 31.5–35.7)
MCV: 91 fL (ref 79–97)
Monocytes Absolute: 0.3 x10E3/uL (ref 0.1–0.9)
Monocytes: 8 %
Neutrophils Absolute: 2.4 x10E3/uL (ref 1.4–7.0)
Neutrophils: 54 %
Platelets: 277 x10E3/uL (ref 150–450)
RBC: 4.99 x10E6/uL (ref 4.14–5.80)
RDW: 13.4 % (ref 11.6–15.4)
WBC: 4.4 x10E3/uL (ref 3.4–10.8)

## 2024-02-14 LAB — LIPID PANEL
Chol/HDL Ratio: 6.7 ratio — ABNORMAL HIGH (ref 0.0–5.0)
Cholesterol, Total: 235 mg/dL — ABNORMAL HIGH (ref 100–199)
HDL: 35 mg/dL — ABNORMAL LOW (ref >39)
LDL Chol Calc (NIH): 110 mg/dL — ABNORMAL HIGH (ref 0–99)
Triglycerides: 521 mg/dL — ABNORMAL HIGH (ref 0–149)
VLDL Cholesterol Cal: 90 mg/dL — ABNORMAL HIGH (ref 5–40)

## 2024-02-14 LAB — COMPREHENSIVE METABOLIC PANEL WITH GFR
ALT: 34 IU/L (ref 0–44)
AST: 24 IU/L (ref 0–40)
Albumin: 4.5 g/dL (ref 3.8–4.9)
Alkaline Phosphatase: 67 IU/L (ref 47–123)
BUN/Creatinine Ratio: 17 (ref 9–20)
BUN: 14 mg/dL (ref 6–24)
Bilirubin Total: 0.6 mg/dL (ref 0.0–1.2)
CO2: 23 mmol/L (ref 20–29)
Calcium: 9 mg/dL (ref 8.7–10.2)
Chloride: 101 mmol/L (ref 96–106)
Creatinine, Ser: 0.84 mg/dL (ref 0.76–1.27)
Globulin, Total: 2.5 g/dL (ref 1.5–4.5)
Glucose: 87 mg/dL (ref 70–99)
Potassium: 4.7 mmol/L (ref 3.5–5.2)
Sodium: 139 mmol/L (ref 134–144)
Total Protein: 7 g/dL (ref 6.0–8.5)
eGFR: 102 mL/min/1.73 (ref >59)

## 2024-02-14 LAB — PSA: Prostate Specific Ag, Serum: 0.8 ng/mL (ref 0.0–4.0)

## 2024-02-16 ENCOUNTER — Encounter: Payer: Self-pay | Admitting: Internal Medicine

## 2024-02-16 ENCOUNTER — Ambulatory Visit: Payer: Self-pay | Admitting: Internal Medicine

## 2024-02-16 VITALS — BP 124/70 | HR 65 | Resp 16 | Ht 67.0 in | Wt 179.0 lb

## 2024-02-16 DIAGNOSIS — H547 Unspecified visual loss: Secondary | ICD-10-CM

## 2024-02-16 DIAGNOSIS — Z0120 Encounter for dental examination and cleaning without abnormal findings: Secondary | ICD-10-CM

## 2024-02-16 DIAGNOSIS — Z23 Encounter for immunization: Secondary | ICD-10-CM

## 2024-02-16 DIAGNOSIS — Z0000 Encounter for general adult medical examination without abnormal findings: Secondary | ICD-10-CM

## 2024-02-16 MED ORDER — FENOFIBRATE 160 MG PO TABS: ORAL_TABLET | ORAL | 11 refills | Status: AC

## 2024-02-16 NOTE — Progress Notes (Unsigned)
 Subjective:    Patient ID: Eric Beasley, male   DOB: 05/05/1965, 58 y.o.   MRN: [id number]   HPI  Here for Male CPE:  1.  STE:  Does perform.  No concerning findings.  No family history of testicular cancer.    2.  PSA: Last normal at 0.8 this month.  No family history of prostate cancer.    3.  Guaiac Cards/FIT:  Last 02/2022 and negative.    4.  Colonoscopy:  Never.  No family history of colon cancer.    5.  Cholesterol/Glucose:  Stopped fenofibrate , not clear why.  He cannot say why he stopped.  No side effects.  Glucose fine at 87 and last year, A1C at 5.6%.    6.  Immunizations:  Has not had COVID nor pneumococcal vaccines.   Immunization History  Administered Date(s) Administered   Influenza Inj Mdck Quad Pf 02/05/2021   Influenza, Seasonal, Injecte, Preservative Fre 11/21/2023   MODERNA COVID-19 SARS-COV-2 PEDS BIVALENT BOOSTER 95yr-100yr 05/24/2019, 06/21/2019   Moderna Covid-19 Fall Seasonal Vaccine 19yrs & older 02/08/2022   Moderna SARS-COV2 Booster Vaccination 04/03/2020   Pfizer Covid-19 Vaccine Bivalent Booster 53yrs & up 02/05/2021   Tdap 02/08/2022   Zoster Recombinant(Shingrix ) 04/26/2021, 04/18/2022     Current Meds  Medication Sig   loratadine  (CLARITIN ) 10 MG tablet Take 10 mg by mouth daily.   psyllium (METAMUCIL) 58.6 % packet Take 1 packet by mouth daily.   Allergies  Allergen Reactions   Diclofenac Other (See Comments)    Abdominal pain   Naproxen Rash   Past Medical History:  Diagnosis Date   Constipation    Environmental and seasonal allergies    GERD (gastroesophageal reflux disease) 2017   Hyperglycemia 2023   Hyperlipidemia    History reviewed. No pertinent surgical history. Family History  Problem Relation Age of Onset   Hypertension Mother    Cirrhosis Father    Alcohol abuse Father    Social History   Socioeconomic History   Marital status: Married    Spouse name: Conception Rosas   Number of children:  3   Years of education: 6   Highest education level: 6th grade  Occupational History   Occupation: [employer]  Tobacco Use   Smoking status: Never    Passive exposure: Never   Smokeless tobacco: Never  Vaping Use   Vaping status: Never Used  Substance and Sexual Activity   Alcohol use: Yes    Comment: Rare   Drug use: No   Sexual activity: Yes    Birth control/protection: None  Other Topics Concern   Not on file  Social History Narrative   Lives with his wife and a friend   Has 2 children with current wife live in [place] as does his oldest daughter, from previous marriage.   Social Drivers of Corporate Investment Banker Strain: Low Risk  (02/16/2024)   Overall Financial Resource Strain (CARDIA)    Difficulty of Paying Living Expenses: Not very hard  Food Insecurity: No Food Insecurity (02/16/2024)   Hunger Vital Sign    Worried About Running Out of Food in the Last Year: Never true    Ran Out of Food in the Last Year: Never true  Transportation Needs: No Transportation Needs (02/16/2024)   PRAPARE - Administrator, Civil Service (Medical): No    Lack of Transportation (Non-Medical): No  Physical Activity: Not on file  Stress: Not on file  Social  Connections: Not on file  Intimate Partner Violence: Not At Risk (02/16/2024)   Humiliation, Afraid, Rape, and Kick questionnaire    Fear of Current or Ex-Partner: No    Emotionally Abused: No    Physically Abused: No    Sexually Abused: No     Review of Systems  HENT:  Negative for dental problem.   Eyes:  Positive for visual disturbance (Decreased visual acuity--needs new glasses).  Respiratory:  Negative for shortness of breath.   Cardiovascular:  Negative for chest pain, palpitations and leg swelling.  Gastrointestinal:  Positive for abdominal pain (When he contracts abdominal wall to stand from sitting, he feels pain in left abdomen--not clear if he feels muscle hardening in left abdomen or a bulge  through his left abdominal wall.  Has occurred twice and last was about 1 month ago.  No hx of injury.). Negative for blood in stool (No melena.).  Genitourinary:  Negative for decreased urine volume, dysuria and frequency.  Neurological:  Negative for weakness and numbness.  Psychiatric/Behavioral:  Negative for dysphoric mood. The patient is not nervous/anxious.       Objective:   BP 124/70 (BP Location: Right Arm, Patient Position: Sitting, Cuff Size: Normal)   Pulse 65   Resp 16   Ht 5' 7 (1.702 m)   Wt 179 lb (81.2 kg)   BMI 28.04 kg/m   Physical Exam HENT:     Head: Normocephalic and atraumatic.     Right Ear: Tympanic membrane, ear canal and external ear normal.     Left Ear: Tympanic membrane, ear canal and external ear normal.     Nose: Nose normal.     Mouth/Throat:     Mouth: Mucous membranes are moist.     Pharynx: Oropharynx is clear.  Eyes:     Extraocular Movements: Extraocular movements intact.     Conjunctiva/sclera: Conjunctivae normal.     Pupils: Pupils are equal, round, and reactive to light.  Neck:     Thyroid: No thyroid mass or thyromegaly.  Cardiovascular:     Rate and Rhythm: Normal rate and regular rhythm.     Heart sounds: S1 normal and S2 normal. No murmur heard.    No friction rub. No S3 or S4 sounds.     Comments: No carotid bruits.  Carotid, radial, femoral, DP and PT pulses normal and equal.   Pulmonary:     Effort: Pulmonary effort is normal.     Breath sounds: Normal breath sounds and air entry.  Abdominal:     General: Bowel sounds are normal.     Palpations: Abdomen is soft. There is no hepatomegaly, splenomegaly or mass.     Tenderness: There is no abdominal tenderness.     Hernia: No hernia is present.      Comments: Minimally tender in circled area.  No palpable hernia or hernial opening.  No rebound or peritoneal sounds.  Genitourinary:    Penis: Normal.      Testes:        Right: Mass or tenderness not present. Right  testis is descended.        Left: Mass or tenderness not present. Left testis is descended.  Musculoskeletal:        General: Normal range of motion.     Cervical back: Normal range of motion and neck supple.     Right lower leg: No edema.     Left lower leg: No edema.  Lymphadenopathy:     Head:  Right side of head: No submental or submandibular adenopathy.     Left side of head: No submental or submandibular adenopathy.     Cervical: No cervical adenopathy.     Upper Body:     Right upper body: No supraclavicular or axillary adenopathy.     Left upper body: No supraclavicular or axillary adenopathy.     Lower Body: No right inguinal adenopathy. No left inguinal adenopathy.  Skin:    General: Skin is warm.     Capillary Refill: Capillary refill takes less than 2 seconds.     Findings: No rash.  Neurological:     General: No focal deficit present.     Mental Status: He is alert and oriented to person, place, and time.     Cranial Nerves: Cranial nerves 2-12 are intact.     Sensory: Sensation is intact.     Motor: Motor function is intact.     Coordination: Coordination is intact.     Gait: Gait is intact.     Deep Tendon Reflexes: Reflexes are normal and symmetric.  Psychiatric:        Mood and Affect: Mood normal.        Behavior: Behavior normal.      Assessment & Plan   CPE FIT to return in 2 weeks. PSA fine Pneumococcal 20 and Spikevax  COVID today.  2.  Hyperlipidemia with high trigs.  Restart Fenofibrated 160 mg and recheck in 2 months.  Discussed he is on chronic treatment for this and not to just take a bottle and stop.    3.  Decreased visual acuity:  referral to optometry  4.  Referral for dental care.    5.  Two episodes of left abdominal pain:  Likely muscle spasm as unable to palpate hernia or hernial opening today.  To call if becomes frequent.  Discussed stretches.

## 2024-03-17 ENCOUNTER — Telehealth: Payer: Self-pay | Admitting: Internal Medicine

## 2024-03-17 NOTE — Telephone Encounter (Signed)
 Patient would like to be seen for  Patient states he has stomach discomfort and is unable to explain what he feels, patient describes feeling inflamed and a bump.  Patient states he mentioned this to the doctor at his previous appointment the doctor checked for hernia but he states he was told he did not have a hernia.   Patient states he would like to be seen and see if he can get a referral for an ultra sound to check further as he explains the feeling as uncomfortable.

## 2024-04-19 ENCOUNTER — Ambulatory Visit: Payer: Self-pay | Admitting: Internal Medicine

## 2024-04-27 ENCOUNTER — Other Ambulatory Visit: Payer: Self-pay

## 2025-02-18 ENCOUNTER — Other Ambulatory Visit: Payer: Self-pay | Admitting: Internal Medicine

## 2025-02-23 ENCOUNTER — Encounter: Payer: Self-pay | Admitting: Internal Medicine
